# Patient Record
Sex: Female | Born: 1950 | Race: White | Hispanic: No | Marital: Married | Smoking: Former smoker
Health system: Southern US, Community
[De-identification: ages and names within clinical notes are randomized; demographics above are authoritative.]

## PROBLEM LIST (undated history)

## (undated) DIAGNOSIS — R569 Unspecified convulsions: Secondary | ICD-10-CM

## (undated) DIAGNOSIS — F419 Anxiety disorder, unspecified: Secondary | ICD-10-CM

## (undated) DIAGNOSIS — I10 Essential (primary) hypertension: Secondary | ICD-10-CM

## (undated) DIAGNOSIS — G35 Multiple sclerosis: Secondary | ICD-10-CM

## (undated) HISTORY — PX: ABDOMINAL HYSTERECTOMY: SHX81

## (undated) HISTORY — PX: EYE SURGERY: SHX253

## (undated) HISTORY — PX: KNEE SURGERY: SHX244

---

## 2014-04-17 ENCOUNTER — Encounter (HOSPITAL_COMMUNITY): Payer: Self-pay | Admitting: Emergency Medicine

## 2014-04-17 ENCOUNTER — Emergency Department (HOSPITAL_COMMUNITY)
Admission: EM | Admit: 2014-04-17 | Discharge: 2014-04-18 | Disposition: A | Discharge status: Home / Self Care | Payer: BC Managed Care – PPO | Attending: Emergency Medicine | Admitting: Emergency Medicine

## 2014-04-17 ENCOUNTER — Emergency Department (HOSPITAL_COMMUNITY): Payer: BC Managed Care – PPO

## 2014-04-17 DIAGNOSIS — R197 Diarrhea, unspecified: Secondary | ICD-10-CM | POA: Insufficient documentation

## 2014-04-17 DIAGNOSIS — G40909 Epilepsy, unspecified, not intractable, without status epilepticus: Secondary | ICD-10-CM | POA: Insufficient documentation

## 2014-04-17 DIAGNOSIS — R110 Nausea: Secondary | ICD-10-CM | POA: Insufficient documentation

## 2014-04-17 DIAGNOSIS — R569 Unspecified convulsions: Secondary | ICD-10-CM

## 2014-04-17 DIAGNOSIS — R631 Polydipsia: Secondary | ICD-10-CM | POA: Insufficient documentation

## 2014-04-17 DIAGNOSIS — R5383 Other fatigue: Secondary | ICD-10-CM

## 2014-04-17 DIAGNOSIS — G35 Multiple sclerosis: Secondary | ICD-10-CM

## 2014-04-17 DIAGNOSIS — R5381 Other malaise: Secondary | ICD-10-CM | POA: Insufficient documentation

## 2014-04-17 DIAGNOSIS — Z87891 Personal history of nicotine dependence: Secondary | ICD-10-CM | POA: Insufficient documentation

## 2014-04-17 DIAGNOSIS — Z79899 Other long term (current) drug therapy: Secondary | ICD-10-CM | POA: Insufficient documentation

## 2014-04-17 HISTORY — DX: Unspecified convulsions: R56.9

## 2014-04-17 LAB — URINALYSIS, ROUTINE W REFLEX MICROSCOPIC
BILIRUBIN URINE: NEGATIVE
GLUCOSE, UA: NEGATIVE mg/dL
Ketones, ur: NEGATIVE mg/dL
Nitrite: NEGATIVE
PH: 6 (ref 5.0–8.0)
Protein, ur: NEGATIVE mg/dL
Specific Gravity, Urine: 1.016 (ref 1.005–1.030)
Urobilinogen, UA: 0.2 mg/dL (ref 0.0–1.0)

## 2014-04-17 LAB — COMPREHENSIVE METABOLIC PANEL
ALT: 25 U/L (ref 0–35)
AST: 25 U/L (ref 0–37)
Albumin: 3.9 g/dL (ref 3.5–5.2)
Alkaline Phosphatase: 104 U/L (ref 39–117)
Anion gap: 14 (ref 5–15)
BUN: 9 mg/dL (ref 6–23)
CALCIUM: 9 mg/dL (ref 8.4–10.5)
CO2: 21 mEq/L (ref 19–32)
Chloride: 89 mEq/L — ABNORMAL LOW (ref 96–112)
Creatinine, Ser: 0.61 mg/dL (ref 0.50–1.10)
GFR calc Af Amer: >90 mL/min (ref >90)
Glucose, Bld: 130 mg/dL — ABNORMAL HIGH (ref 70–99)
Potassium: 3.8 mEq/L (ref 3.7–5.3)
SODIUM: 124 meq/L — AB (ref 137–147)
Total Bilirubin: 0.2 mg/dL — ABNORMAL LOW (ref 0.3–1.2)
Total Protein: 7.1 g/dL (ref 6.0–8.3)

## 2014-04-17 LAB — CBC WITH DIFFERENTIAL/PLATELET
Basophils Absolute: 0.1 10*3/uL (ref 0.0–0.1)
Basophils Relative: 1 % (ref 0–1)
EOS PCT: 1 % (ref 0–5)
Eosinophils Absolute: 0.1 10*3/uL (ref 0.0–0.7)
HCT: 33.7 % — ABNORMAL LOW (ref 36.0–46.0)
Hemoglobin: 11.3 g/dL — ABNORMAL LOW (ref 12.0–15.0)
LYMPHS ABS: 1.2 10*3/uL (ref 0.7–4.0)
LYMPHS PCT: 12 % (ref 12–46)
MCH: 28.5 pg (ref 26.0–34.0)
MCHC: 33.5 g/dL (ref 30.0–36.0)
MCV: 84.9 fL (ref 78.0–100.0)
Monocytes Absolute: 0.7 10*3/uL (ref 0.1–1.0)
Monocytes Relative: 7 % (ref 3–12)
Neutro Abs: 7.6 10*3/uL (ref 1.7–7.7)
Neutrophils Relative %: 79 % — ABNORMAL HIGH (ref 43–77)
Platelets: 317 10*3/uL (ref 150–400)
RBC: 3.97 MIL/uL (ref 3.87–5.11)
RDW: 14.9 % (ref 11.5–15.5)
WBC: 9.6 10*3/uL (ref 4.0–10.5)

## 2014-04-17 LAB — URINE MICROSCOPIC-ADD ON

## 2014-04-17 LAB — ETHANOL: Alcohol, Ethyl (B): <11 mg/dL (ref 0–11)

## 2014-04-17 MED ORDER — LEVETIRACETAM 500 MG PO TABS
500.0000 mg | ORAL_TABLET | Freq: Once | ORAL | Status: AC
  Administered 2014-04-17: 500 mg via ORAL
  Filled 2014-04-17 (×2): qty 1

## 2014-04-17 MED ORDER — SODIUM CHLORIDE 0.9 % IV SOLN
INTRAVENOUS | Status: DC
  Administered 2014-04-17: 19:00:00 via INTRAVENOUS

## 2014-04-17 NOTE — ED Provider Notes (Signed)
CSN: [id number]     Arrival date & time 04/17/14  1654 History   First MD Initiated Contact with Patient 04/17/14 1706     Chief Complaint  Patient presents with  . Seizures     (Consider location/radiation/quality/duration/timing/severity/associated sxs/prior Treatment) Patient is a 63 y.o. female presenting with seizures.  Seizures  Tara Wade is a 63 year old female with a past medical history of multiple sclerosis, Raynaud's, Sjogren's who was brought in to the ER by EMS after family witnessed the patient having a seizure and called 911. Patient's family who was on seen is present in her room. Family states that they witnessed patient having tonic clonic "jerking" movements for approximately 5-7 minutes. EMS reported on their arrival patient was nonverbal and by the end of their transport patient was asking repetitive questions. Patient is responding to questions appropriately during exam. Patient states she remembers vacuuming her house, then the next thing she remembers there were "a bunch of people in my house".  Patient complains of mild nausea and generalized weakness. Patient denies any recent illness other than experiencing diarrhea past 2 days. Patient denies chest pain, shortness of breath, nausea, vomiting, dizziness, blurred vision, headache, dysuria. Past Medical History  Diagnosis Date  . Seizures    Past Surgical History  Procedure Laterality Date  . Abdominal hysterectomy    . Knee surgery Left   . Eye surgery     No family history on file. History  Substance Use Topics  . Smoking status: Former Smoker -- 1.00 packs/day for 25 years    Types: Cigarettes    Quit date: 01/17/2014  . Smokeless tobacco: Never Used  . Alcohol Use: Yes     Comment: Socially    OB History   Grav Para Term Preterm Abortions TAB SAB Ect Mult Living                 Review of Systems  Constitutional: Negative for fever and chills.  Eyes: Negative for visual disturbance.  Respiratory:  Negative for shortness of breath.   Cardiovascular: Negative for chest pain and leg swelling.  Gastrointestinal: Positive for nausea and diarrhea. Negative for vomiting and abdominal pain.  Endocrine: Positive for polydipsia.  Genitourinary: Negative for dysuria.  Musculoskeletal: Negative for neck stiffness.  Skin: Negative for rash.  Neurological: Positive for seizures and weakness. Negative for dizziness, numbness and headaches.  Psychiatric/Behavioral: Negative.       Allergies  Review of patient's allergies indicates no known allergies.  Home Medications   Prior to Admission medications   Medication Sig Start Date End Date Taking? Authorizing Provider  albuterol (PROVENTIL HFA;VENTOLIN HFA) 108 (90 BASE) MCG/ACT inhaler Inhale 2 puffs into the lungs every 6 (six) hours as needed for wheezing or shortness of breath.   Yes Historical Provider, MD  ALPRAZolam Prudy Feeler) 0.5 MG tablet Take 0.5 mg by mouth 3 (three) times daily as needed for anxiety.   Yes Historical Provider, MD  amLODipine (NORVASC) 5 MG tablet Take 5 mg by mouth daily.   Yes Historical Provider, MD  donepezil (ARICEPT) 10 MG tablet Take 10 mg by mouth at bedtime.   Yes Historical Provider, MD  glatiramer (COPAXONE) 20 MG/ML SOSY injection Inject 20 mg into the skin daily.   Yes Historical Provider, MD  omeprazole (PRILOSEC) 20 MG capsule Take 20 mg by mouth daily.   Yes Historical Provider, MD  sertraline (ZOLOFT) 100 MG tablet Take 100 mg by mouth daily.   Yes Historical Provider, MD  tolterodine (  DETROL LA) 4 MG 24 hr capsule Take 4 mg by mouth daily.   Yes Historical Provider, MD  traZODone (DESYREL) 100 MG tablet Take 100 mg by mouth at bedtime.   Yes Historical Provider, MD   BP 148/96  Pulse 60  Temp(Src) 98 F (36.7 C) (Oral)  Resp 18  SpO2 100% Physical Exam  Constitutional: She is oriented to person, place, and time.  Non-toxic appearance. She does not have a sickly appearance. She does not appear ill.   Patient is a thin female sitting upright in the bed in no acute distress. Patient answered questions in full, clear sentences.  HENT:  Mouth/Throat: Oropharynx is clear and moist and mucous membranes are normal.  Eyes: EOM are normal. Pupils are equal, round, and reactive to light.  Neck: Full passive range of motion without pain.  Cardiovascular: Normal rate, regular rhythm, S1 normal, S2 normal, normal heart sounds and normal pulses.   No murmur heard. Pulses:      Radial pulses are 2+ on the right side, and 2+ on the left side.  Pulmonary/Chest: Not tachypneic. No respiratory distress.  Abdominal: Soft. Normal appearance and bowel sounds are normal. There is no tenderness.  Neurological: She is alert and oriented to person, place, and time. She has normal strength. A cranial nerve deficit is present. No sensory deficit. GCS eye subscore is 4. GCS verbal subscore is 5. GCS motor subscore is 6.  Skin: Skin is warm and dry.  Psychiatric: She has a normal mood and affect.    ED Course  Procedures (including critical care time) Labs Review Labs Reviewed  COMPREHENSIVE METABOLIC PANEL - Abnormal; Notable for the following:    Sodium 124 (*)    Chloride 89 (*)    Glucose, Bld 130 (*)    Total Bilirubin 0.2 (*)    All other components within normal limits  CBC WITH DIFFERENTIAL - Abnormal; Notable for the following:    Hemoglobin 11.3 (*)    HCT 33.7 (*)    Neutrophils Relative % 79 (*)    All other components within normal limits  URINALYSIS, ROUTINE W REFLEX MICROSCOPIC - Abnormal; Notable for the following:    Hgb urine dipstick TRACE (*)    Leukocytes, UA TRACE (*)    All other components within normal limits  ETHANOL  URINE MICROSCOPIC-ADD ON  DRUG SCREEN PANEL (SERUM)    Imaging Review Ct Head Wo Contrast  04/17/2014   CLINICAL DATA:  Seizure, altered mental status.  EXAM: CT HEAD WITHOUT CONTRAST  TECHNIQUE: Contiguous axial images were obtained from the base of the  skull through the vertex without intravenous contrast.  COMPARISON:  None.  FINDINGS: The ventricles and sulci are normal. No intraparenchymal hemorrhage, mass effect nor midline shift. No acute large vascular territory infarcts. Mild patchy supratentorial white matter hypodensities.  No abnormal extra-axial fluid collections. Basal cisterns are patent. Minimal calcific atherosclerosis of the carotid siphons.  No skull fracture. The included ocular globes and orbital contents are non-suspicious. Status post bilateral ocular lens implants. The mastoid aircells and included paranasal sinuses are well-aerated.  IMPRESSION: No acute intracranial process.  Mild white matter changes suggest chronic small vessel ischemic disease.   Electronically Signed   By: Awilda Metroourtnay  Bloomer   On: 04/17/2014 18:22     EKG Interpretation   Date/Time:  Thursday April 17 2014 18:00:27 EDT Ventricular Rate:  78 PR Interval:  168 QRS Duration: 80 QT Interval:  377 QTC Calculation: 429 R Axis:  74 Text Interpretation:  Sinus rhythm Probable left atrial enlargement  Anteroseptal infarct, age indeterminate No old tracing to compare  Confirmed by [facility]  MD, ELLIOTT [phone]) on 04/17/2014 9:36:23 PM      MDM   Final diagnoses:  None    Patient is a 63 year old female with past medical history of MS, Sjogren's, Raynaud's, and one seizure approximately 15 years ago who presented by EMS tonight after having a seizure.  Due to the fact patient was never treated for seizure medication, she only had one seizure in the past, and the length between her last seizure and tonight's, her present illness treated as a new seizure. This presentation is initially concerning for an intracranial abnormality, electrolyte abnormality, iatrogenic medical complication, or withdrawal from medication/alcohol.    1907:  Patient's CT returned and showed no acute intracranial process with mild white matter changes suggestive of chronic small vessel  ischemic disease. Patient is hyponatremic at 124. Patient was placed on 0.9% normal saline to correct sodium.  I talked with the patient at this time and she is not quite as upset. She is less confused and completely alert and oriented. Patient is on complaint to me at this time is that she wishes to go home soon. Upon interviewing patient about her medications patient stated that her PCP has recently been trying to taper her from Clonopin, and Xanax. She states that the last dose of Klonopin she had was on Saturday, 04/12/14. She states that she lives in [place], and is visiting family here in [place]. She states that since being down here, she has had increased first and has drank approximately 5-6 L of either water or iced tea every day. She states that she does not usually drink this amount of fluids back in [place].    Dr. Effie Shy discussed the case on-call neuro hospitalists who recommended starting Keppra 500 mg twice a day based on patient's history and stated her seizures were most likely attributed to her multiple sclerosis and having patient follow up with her neurologist back home in [place]. As discussed with patient's family and patient were agreeable to this plan. Patient discharged with information on seizures. Patient was encouraged to followup with her PCP and her neurologist back in [place]. Due to the fact the patient is out of town for a few more days, we encouraged her to call or return to the ED should she have any questions or concerns.  Signed,  Ladona Mow, PA-C 1:33 AM   Tara Fantasia, PA-C 04/18/14 [phone]

## 2014-04-17 NOTE — ED Notes (Signed)
Pt walking to the bathroom without any issues

## 2014-04-17 NOTE — ED Notes (Addendum)
Per EMS: The patient called her friend from upstairs. The friend states that when he came downstairs he found the patient shaking with muscles tightened, which lasted 5-7 minutes. EMS states that upon arrival the patient was non-verbal, and now repetitively asks "what happened to me?" Patient states that she has had a seizure in the past, however doesn't take any medication for seizures. EMS reports no trauma, no incontinence, no tongue laceration.

## 2014-04-17 NOTE — Discharge Instructions (Signed)
Seizure, Adult °A seizure is abnormal electrical activity in the brain. Seizures usually last from 30 seconds to 2 minutes. There are various types of seizures. °Before a seizure, you may have a warning sensation (aura) that a seizure is about to occur. An aura may include the following symptoms:  °· Fear or anxiety. °· Nausea. °· Feeling like the room is spinning (vertigo). °· Vision changes, such as seeing flashing lights or spots. °Common symptoms during a seizure include: °· A change in attention or behavior (altered mental status). °· Convulsions with rhythmic jerking movements. °· Drooling. °· Rapid eye movements. °· Grunting. °· Loss of bladder and bowel control. °· Bitter taste in the mouth. °· Tongue biting. °After a seizure, you may feel confused and sleepy. You may also have an injury resulting from convulsions during the seizure. °HOME CARE INSTRUCTIONS  °· If you are given medicines, take them exactly as prescribed by your health care provider. °· Keep all follow-up appointments as directed by your health care provider. °· Do not swim or drive or engage in risky activity during which a seizure could cause further injury to you or others until your health care provider says it is OK. °· Get adequate rest. °· Teach friends and family what to do if you have a seizure. They should: °· Lay you on the ground to prevent a fall. °· Put a cushion under your head. °· Loosen any tight clothing around your neck. °· Turn you on your side. If vomiting occurs, this helps keep your airway clear. °· Stay with you until you recover. °· Know whether or not you need emergency care. °SEEK IMMEDIATE MEDICAL CARE IF: °· The seizure lasts longer than 5 minutes. °· The seizure is severe or you do not wake up immediately after the seizure. °· You have an altered mental status after the seizure. °· You are having more frequent or worsening seizures. °Someone should drive you to the emergency department or call local emergency  services (911 in [place] °MAKE SURE YOU: °· Understand these instructions. °· Will watch your condition. °· Will get help right away if you are not doing well or get worse. °Document Released: 09/02/2000 Document Revised: 06/26/2013 Document Reviewed: 04/17/2013 °[employer]® Patient Information ©2015 [employer]. This information is not intended to replace advice given to you by your health care provider. Make sure you discuss any questions you have with your health care provider. ° °Driving and Equipment Restrictions °Some medical problems make it dangerous to drive, ride a bike, or use machines. Some of these problems are: °· A hard blow to the head (concussion). °· Passing out (fainting). °· Twitching and shaking (seizures). °· Low blood sugar. °· Taking medicine to help you relax (sedatives). °· Taking pain medicines. °· Wearing an eye patch. °· Wearing splints. This can make it hard to use parts of your body that you need to drive safely. °HOME CARE  °· Do not drive until your doctor says it is okay. °· Do not use machines until your doctor says it is okay. °You may need a form signed by your doctor (medical release) before you can drive again. You may also need this form before you do other tasks where you need to be fully alert. °MAKE SURE YOU: °· Understand these instructions. °· Will watch your condition. °· Will get help right away if you are not doing well or get worse. °Document Released: 10/13/2004 Document Revised: 11/28/2011 Document Reviewed: 01/13/2010 °[employer]® Patient Information ©2015 [employer].   This information is not intended to replace advice given to you by your health care provider. Make sure you discuss any questions you have with your health care provider. ° °

## 2014-04-17 NOTE — ED Provider Notes (Signed)
  Face-to-face evaluation   History: She had a tonic-clonic seizure tonight. Witnessed by family. Postictal state 15-20 minutes. No trauma. 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 No change in Xanax dose for 3 weeks. No new medications.  Physical exam: Alert female. Nonfocal neurologic exam. No dysarthria, dysphasia, or nystagmus.  I discussed the case with on-call neuro hospitalist- he recommends starting Keppra 500 mg twice a day  I discussed these findings with family members in the room, and another one, and ER doctor, on the phone.   Medical screening examination/treatment/procedure(s) were conducted as a shared visit with non-physician practitioner(s) and myself.  I personally evaluated the patient during the encounter  Flint Melter, MD 04/18/14 1205

## 2014-04-18 MED ORDER — LEVETIRACETAM 500 MG PO TABS: 500.0000 mg | ORAL_TABLET | Freq: Two times a day (BID) | ORAL | Status: DC

## 2014-04-20 LAB — DRUG SCREEN PANEL (SERUM)

## 2020-01-28 ENCOUNTER — Inpatient Hospital Stay (HOSPITAL_COMMUNITY)
Admission: EM | Admit: 2020-01-28 | Discharge: 2020-01-30 | DRG: 101 | Disposition: A | Discharge status: Home / Self Care | Payer: Medicare (Managed Care) | Attending: Internal Medicine | Admitting: Internal Medicine

## 2020-01-28 ENCOUNTER — Emergency Department (HOSPITAL_COMMUNITY): Payer: Medicare (Managed Care)

## 2020-01-28 ENCOUNTER — Encounter (HOSPITAL_COMMUNITY): Payer: Self-pay | Admitting: Emergency Medicine

## 2020-01-28 ENCOUNTER — Other Ambulatory Visit: Payer: Self-pay

## 2020-01-28 DIAGNOSIS — G40909 Epilepsy, unspecified, not intractable, without status epilepticus: Principal | ICD-10-CM | POA: Diagnosis present

## 2020-01-28 DIAGNOSIS — Z87891 Personal history of nicotine dependence: Secondary | ICD-10-CM

## 2020-01-28 DIAGNOSIS — E878 Other disorders of electrolyte and fluid balance, not elsewhere classified: Secondary | ICD-10-CM | POA: Diagnosis not present

## 2020-01-28 DIAGNOSIS — D649 Anemia, unspecified: Secondary | ICD-10-CM | POA: Diagnosis present

## 2020-01-28 DIAGNOSIS — Z79899 Other long term (current) drug therapy: Secondary | ICD-10-CM

## 2020-01-28 DIAGNOSIS — Z20822 Contact with and (suspected) exposure to covid-19: Secondary | ICD-10-CM | POA: Diagnosis present

## 2020-01-28 DIAGNOSIS — E876 Hypokalemia: Secondary | ICD-10-CM | POA: Diagnosis present

## 2020-01-28 DIAGNOSIS — I10 Essential (primary) hypertension: Secondary | ICD-10-CM | POA: Diagnosis present

## 2020-01-28 DIAGNOSIS — S0003XA Contusion of scalp, initial encounter: Secondary | ICD-10-CM | POA: Insufficient documentation

## 2020-01-28 DIAGNOSIS — G35 Multiple sclerosis: Secondary | ICD-10-CM | POA: Diagnosis present

## 2020-01-28 DIAGNOSIS — F419 Anxiety disorder, unspecified: Secondary | ICD-10-CM | POA: Diagnosis present

## 2020-01-28 DIAGNOSIS — H8112 Benign paroxysmal vertigo, left ear: Secondary | ICD-10-CM | POA: Diagnosis present

## 2020-01-28 DIAGNOSIS — H8110 Benign paroxysmal vertigo, unspecified ear: Secondary | ICD-10-CM | POA: Diagnosis present

## 2020-01-28 DIAGNOSIS — S0990XA Unspecified injury of head, initial encounter: Secondary | ICD-10-CM

## 2020-01-28 DIAGNOSIS — E871 Hypo-osmolality and hyponatremia: Secondary | ICD-10-CM | POA: Diagnosis present

## 2020-01-28 DIAGNOSIS — R519 Headache, unspecified: Secondary | ICD-10-CM | POA: Diagnosis present

## 2020-01-28 DIAGNOSIS — R42 Dizziness and giddiness: Secondary | ICD-10-CM

## 2020-01-28 DIAGNOSIS — Z820 Family history of epilepsy and other diseases of the nervous system: Secondary | ICD-10-CM

## 2020-01-28 DIAGNOSIS — R569 Unspecified convulsions: Secondary | ICD-10-CM

## 2020-01-28 DIAGNOSIS — Z90710 Acquired absence of both cervix and uterus: Secondary | ICD-10-CM

## 2020-01-28 HISTORY — DX: Anxiety disorder, unspecified: F41.9

## 2020-01-28 HISTORY — DX: Essential (primary) hypertension: I10

## 2020-01-28 HISTORY — DX: Multiple sclerosis: G35

## 2020-01-28 LAB — URINALYSIS, ROUTINE W REFLEX MICROSCOPIC
Bacteria, UA: NONE SEEN
Bilirubin Urine: NEGATIVE
Glucose, UA: NEGATIVE mg/dL
Ketones, ur: 5 mg/dL — AB
Leukocytes,Ua: NEGATIVE
Nitrite: NEGATIVE
Protein, ur: NEGATIVE mg/dL
Specific Gravity, Urine: 1.009 (ref 1.005–1.030)
pH: 6 (ref 5.0–8.0)

## 2020-01-28 LAB — CBC
HCT: 35 % — ABNORMAL LOW (ref 36.0–46.0)
Hemoglobin: 11.5 g/dL — ABNORMAL LOW (ref 12.0–15.0)
MCH: 30.7 pg (ref 26.0–34.0)
MCHC: 32.9 g/dL (ref 30.0–36.0)
MCV: 93.6 fL (ref 80.0–100.0)
Platelets: 359 10*3/uL (ref 150–400)
RBC: 3.74 MIL/uL — ABNORMAL LOW (ref 3.87–5.11)
RDW: 12.6 % (ref 11.5–15.5)
WBC: 8.2 10*3/uL (ref 4.0–10.5)
nRBC: 0 % (ref 0.0–0.2)

## 2020-01-28 LAB — COMPREHENSIVE METABOLIC PANEL
ALT: 18 U/L (ref 0–44)
AST: 27 U/L (ref 15–41)
Albumin: 4 g/dL (ref 3.5–5.0)
Alkaline Phosphatase: 85 U/L (ref 38–126)
Anion gap: 14 (ref 5–15)
BUN: 11 mg/dL (ref 8–23)
CO2: 17 mmol/L — ABNORMAL LOW (ref 22–32)
Calcium: 7.8 mg/dL — ABNORMAL LOW (ref 8.9–10.3)
Chloride: 100 mmol/L (ref 98–111)
Creatinine, Ser: 0.94 mg/dL (ref 0.44–1.00)
GFR calc Af Amer: >60 mL/min (ref >60)
GFR calc non Af Amer: >60 mL/min (ref >60)
Glucose, Bld: 115 mg/dL — ABNORMAL HIGH (ref 70–99)
Potassium: 2.7 mmol/L — CL (ref 3.5–5.1)
Sodium: 131 mmol/L — ABNORMAL LOW (ref 135–145)
Total Bilirubin: 0.4 mg/dL (ref 0.3–1.2)
Total Protein: 6.3 g/dL — ABNORMAL LOW (ref 6.5–8.1)

## 2020-01-28 LAB — RAPID URINE DRUG SCREEN, HOSP PERFORMED
Amphetamines: NOT DETECTED
Barbiturates: NOT DETECTED
Benzodiazepines: NOT DETECTED
Cocaine: NOT DETECTED
Opiates: NOT DETECTED
Tetrahydrocannabinol: NOT DETECTED

## 2020-01-28 LAB — ETHANOL: Alcohol, Ethyl (B): <10 mg/dL (ref <10)

## 2020-01-28 LAB — MAGNESIUM: Magnesium: 0.7 mg/dL — CL (ref 1.7–2.4)

## 2020-01-28 MED ORDER — POTASSIUM CHLORIDE 10 MEQ/100ML IV SOLN
10.0000 meq | INTRAVENOUS | Status: AC
  Administered 2020-01-28 (×2): 10 meq via INTRAVENOUS
  Filled 2020-01-28 (×2): qty 100

## 2020-01-28 MED ORDER — SODIUM CHLORIDE 1 G PO TABS
1.0000 g | ORAL_TABLET | Freq: Two times a day (BID) | ORAL | Status: DC
  Administered 2020-01-29 – 2020-01-30 (×4): 1 g via ORAL
  Filled 2020-01-28 (×5): qty 1

## 2020-01-28 MED ORDER — POTASSIUM CHLORIDE CRYS ER 20 MEQ PO TBCR
60.0000 meq | EXTENDED_RELEASE_TABLET | Freq: Once | ORAL | Status: AC
  Administered 2020-01-28: 60 meq via ORAL
  Filled 2020-01-28: qty 3

## 2020-01-28 MED ORDER — HYDRALAZINE HCL 20 MG/ML IJ SOLN
10.0000 mg | INTRAMUSCULAR | Status: DC | PRN
  Administered 2020-01-29: 10 mg via INTRAVENOUS
  Filled 2020-01-28: qty 1

## 2020-01-28 MED ORDER — SODIUM CHLORIDE 0.9 % IV BOLUS
1000.0000 mL | Freq: Once | INTRAVENOUS | Status: AC
  Administered 2020-01-28: 1000 mL via INTRAVENOUS

## 2020-01-28 MED ORDER — CALCIUM CARBONATE ANTACID 500 MG PO CHEW
400.0000 mg | CHEWABLE_TABLET | Freq: Once | ORAL | Status: AC
  Administered 2020-01-28: 400 mg via ORAL
  Filled 2020-01-28: qty 2

## 2020-01-28 MED ORDER — LORAZEPAM 2 MG/ML IJ SOLN: 1.0000 mg | INTRAMUSCULAR | Status: DC | PRN

## 2020-01-28 MED ORDER — OXCARBAZEPINE 300 MG PO TABS
300.0000 mg | ORAL_TABLET | Freq: Every day | ORAL | Status: DC
  Administered 2020-01-28 – 2020-01-29 (×2): 300 mg via ORAL
  Filled 2020-01-28 (×2): qty 1

## 2020-01-28 MED ORDER — LEVETIRACETAM IN NACL 1500 MG/100ML IV SOLN
1500.0000 mg | Freq: Once | INTRAVENOUS | Status: DC
  Filled 2020-01-28: qty 100

## 2020-01-28 MED ORDER — KETOROLAC TROMETHAMINE 15 MG/ML IJ SOLN
15.0000 mg | Freq: Once | INTRAMUSCULAR | Status: AC
  Administered 2020-01-28: 15 mg via INTRAVENOUS
  Filled 2020-01-28: qty 1

## 2020-01-28 MED ORDER — SODIUM CHLORIDE 0.9 % IV BOLUS
500.0000 mL | Freq: Once | INTRAVENOUS | Status: AC
  Administered 2020-01-28: 500 mL via INTRAVENOUS

## 2020-01-28 MED ORDER — SODIUM CHLORIDE 0.9 % IV SOLN
1.0000 g | Freq: Once | INTRAVENOUS | Status: DC
  Filled 2020-01-28: qty 10

## 2020-01-28 MED ORDER — BUSPIRONE HCL 15 MG PO TABS
7.5000 mg | ORAL_TABLET | Freq: Two times a day (BID) | ORAL | Status: DC
  Administered 2020-01-29 – 2020-01-30 (×4): 7.5 mg via ORAL
  Filled 2020-01-28 (×5): qty 1

## 2020-01-28 MED ORDER — QUETIAPINE FUMARATE ER 400 MG PO TB24
400.0000 mg | ORAL_TABLET | Freq: Every day | ORAL | Status: DC
  Administered 2020-01-29 – 2020-01-30 (×2): 400 mg via ORAL
  Filled 2020-01-28 (×2): qty 1

## 2020-01-28 MED ORDER — METOCLOPRAMIDE HCL 5 MG/ML IJ SOLN
10.0000 mg | Freq: Once | INTRAMUSCULAR | Status: AC
  Administered 2020-01-28: 10 mg via INTRAVENOUS
  Filled 2020-01-28: qty 2

## 2020-01-28 MED ORDER — SERTRALINE HCL 100 MG PO TABS
100.0000 mg | ORAL_TABLET | Freq: Every day | ORAL | Status: DC
  Administered 2020-01-29 – 2020-01-30 (×2): 100 mg via ORAL
  Filled 2020-01-28 (×2): qty 1

## 2020-01-28 MED ORDER — VENLAFAXINE HCL ER 75 MG PO CP24
75.0000 mg | ORAL_CAPSULE | Freq: Every day | ORAL | Status: DC
  Administered 2020-01-29 – 2020-01-30 (×2): 75 mg via ORAL
  Filled 2020-01-28 (×3): qty 1

## 2020-01-28 MED ORDER — MAGNESIUM SULFATE 2 GM/50ML IV SOLN
2.0000 g | Freq: Once | INTRAVENOUS | Status: AC
  Administered 2020-01-28: 2 g via INTRAVENOUS
  Filled 2020-01-28: qty 50

## 2020-01-28 NOTE — ED Provider Notes (Signed)
[facility] [place] EMERGENCY DEPARTMENT Provider Note   CSN: [id number] Arrival date & time: 01/28/20  1731     History Chief Complaint  Patient presents with  . Seizures    Tara Wade is a 69 y.o. female with a history of multiple sclerosis, seizures, presented emergency department with concern for seizure.  Patient reports that she went to get her haircut and went to [employer] today and then does not remember without afterwards.  EMS reported that the patient exited the store and was found on the ground, initially concern for postictal.  The patient's daughter-in-law arrived on scene and said the patient initially was extremely confused.  She did seem to wake up more as EMS arrived.  Patient reports hitting the back of her head on the ground.  She has a minor headache.  She denies myalgias anywhere else.  She denies neck pain.  She is not on blood thinners.  She was placed in a C-spine collar by EMS.  Here she feels lightheaded with sitting upright.  She does report a distant history of seizures but does not think she is had a seizure in approximately 4 years.  Per her medical list provided by her daughter, she is on oxcarbamazapine 300 mg x 1 daily.  She is from [place], and has neurologist in [place] for her multiple sclerosis.  She was planning to return to [place] tomorrow with her daughter in law driving.  Per our records she was last treated for seizure in 2015 and prescribed Keppra 500 mg BID at that time.  She was noted to have some hyponatremia (Na 124) at that time felt to be iatrogenic and due to her drinking 5-6L of water every day.    HPI     Past Medical History:  Diagnosis Date  . Anxiety   . Hypertension   . Multiple sclerosis ([place])   . Seizures [facility])     Patient Active Problem List   Diagnosis Date Noted  . Seizure ([place]) 01/28/2020  . Electrolyte abnormality 01/28/2020  . Multiple sclerosis ([place]) 01/28/2020  . Essential hypertension  01/28/2020  . Hematoma of scalp     Past Surgical History:  Procedure Laterality Date  . ABDOMINAL HYSTERECTOMY    . EYE SURGERY    . KNEE SURGERY Left      OB History   No obstetric history on file.     Family History  Problem Relation Age of Onset  . Multiple sclerosis Brother   . Seizures Nephew     Social History   Tobacco Use  . Smoking status: Former Smoker    Packs/day: 1.00    Years: 25.00    Pack years: 25.00    Types: Cigarettes    Quit date: 01/17/2014    Years since quitting: 6.0  . Smokeless tobacco: Never Used  Substance Use Topics  . Alcohol use: Yes    Comment: Socially   . Drug use: No    Home Medications Prior to Admission medications   Medication Sig Start Date End Date Taking? Authorizing Provider  albuterol (PROVENTIL HFA;VENTOLIN HFA) 108 (90 BASE) MCG/ACT inhaler Inhale 2 puffs into the lungs every 6 (six) hours as needed for wheezing or shortness of breath.   Yes [provider]  amLODipine (NORVASC) 10 MG tablet Take 10 mg by mouth daily. 01/23/20  Yes [provider]  Biotin 1000 MCG tablet Take 1,000 mcg by mouth in the morning and at bedtime.   Yes  [provider]  busPIRone (BUSPAR) 7.5 MG tablet Take 7.5 mg by mouth 2 (two) times daily. 01/24/20  Yes [provider]  levETIRAcetam (KEPPRA) 500 MG tablet Take 1 tablet (500 mg total) by mouth 2 (two) times daily. 04/18/14  Yes Mancel Bale, MD  modafinil (PROVIGIL) 200 MG tablet Take 200 mg by mouth every morning. 12/26/19  Yes [provider]  omeprazole (PRILOSEC) 20 MG capsule Take 20 mg by mouth in the morning and at bedtime.    Yes [provider]  QUEtiapine (SEROQUEL XR) 400 MG 24 hr tablet Take 400 mg by mouth daily. 01/23/20  Yes [provider]  RESTASIS 0.05 % ophthalmic emulsion Place 1 drop into both eyes 2 (two) times daily. 11/11/19  Yes [provider]  sertraline (ZOLOFT) 100 MG tablet Take 100 mg by mouth  daily.   Yes [provider]  sodium chloride 1 g tablet Take 1 g by mouth 2 (two) times daily. 01/23/20  Yes [provider]  tolterodine (DETROL LA) 4 MG 24 hr capsule Take 4 mg by mouth daily.   Yes [provider]  venlafaxine XR (EFFEXOR-XR) 75 MG 24 hr capsule Take 75 mg by mouth daily. 01/23/20  Yes [provider]  ALPRAZolam Prudy Feeler) 0.5 MG tablet Take 0.5 mg by mouth 3 (three) times daily as needed for anxiety.    [provider]  donepezil (ARICEPT) 10 MG tablet Take 10 mg by mouth at bedtime.    [provider]  gabapentin (NEURONTIN) 600 MG tablet Take 600 mg by mouth in the morning, at noon, and at bedtime. 05/02/06   [provider]  traZODone (DESYREL) 100 MG tablet Take 100 mg by mouth at bedtime.    [provider]    Allergies    Patient has no known allergies.  Review of Systems   Review of Systems  Constitutional: Negative for chills and fever.  Eyes: Negative for photophobia and visual disturbance.  Respiratory: Negative for cough and shortness of breath.   Cardiovascular: Negative for chest pain and palpitations.  Gastrointestinal: Negative for abdominal pain and vomiting.  Genitourinary: Negative for dysuria and hematuria.  Musculoskeletal: Negative for arthralgias, back pain, neck pain and neck stiffness.  Skin: Negative for color change and rash.  Neurological: Positive for seizures, light-headedness and headaches. Negative for syncope.  Psychiatric/Behavioral: Negative for agitation and confusion.  All other systems reviewed and are negative.   Physical Exam Updated Vital Signs BP (!) 148/79   Pulse 64   Temp 97.8 F (36.6 C) (Oral)   Resp 13   Ht 5\' 3"  (1.6 m)   Wt 47.6 kg   SpO2 99%   BMI 18.60 kg/m   Physical Exam Vitals and nursing note reviewed.  Constitutional:      General: She is not in acute distress.    Appearance: She is well-developed.  HENT:     Head: Normocephalic.      Comments: Hematoma to back of scalp (occiput), boggy, with macerated overlying skin but no open laceration, mild oozing Eyes:     Conjunctiva/sclera: Conjunctivae normal.  Neck:     Comments: C spine collar cleared, no spinal tenderness Cardiovascular:     Rate and Rhythm: Normal rate and regular rhythm.     Pulses: Normal pulses.  Pulmonary:     Effort: Pulmonary effort is normal. No respiratory distress.     Breath sounds: Normal breath sounds.  Abdominal:     Palpations: Abdomen  is soft.     Tenderness: There is no abdominal tenderness.  Musculoskeletal:     Cervical back: Normal range of motion and neck supple. No tenderness.  Skin:    General: Skin is warm and dry.  Neurological:     General: No focal deficit present.     Mental Status: She is alert and oriented to person, place, and time.     Motor: No weakness.  Psychiatric:        Mood and Affect: Mood normal.        Behavior: Behavior normal.     ED Results / Procedures / Treatments   Labs (all labs ordered are listed, but only abnormal results are displayed) Labs Reviewed  COMPREHENSIVE METABOLIC PANEL - Abnormal; Notable for the following components:      Result Value   Sodium 131 (*)    Potassium 2.7 (*)    CO2 17 (*)    Glucose, Bld 115 (*)    Calcium 7.8 (*)    Total Protein 6.3 (*)    All other components within normal limits  CBC - Abnormal; Notable for the following components:   RBC 3.74 (*)    Hemoglobin 11.5 (*)    HCT 35.0 (*)    All other components within normal limits  URINALYSIS, ROUTINE W REFLEX MICROSCOPIC - Abnormal; Notable for the following components:   Color, Urine STRAW (*)    Hgb urine dipstick SMALL (*)    Ketones, ur 5 (*)    All other components within normal limits  MAGNESIUM - Abnormal; Notable for the following components:   Magnesium 0.7 (*)    All other components within normal limits  SARS CORONAVIRUS 2 (TAT 6-24 HRS)  ETHANOL  RAPID URINE DRUG SCREEN, HOSP  PERFORMED  HIV ANTIBODY (ROUTINE TESTING W REFLEX)  10-HYDROXYCARBAZEPINE    EKG None  Radiology CT Head Wo Contrast  Result Date: 01/28/2020 CLINICAL DATA:  Status post trauma. EXAM: CT HEAD WITHOUT CONTRAST TECHNIQUE: Contiguous axial images were obtained from the base of the skull through the vertex without intravenous contrast. COMPARISON:  April 17, 2014 FINDINGS: Brain: There is mild cerebral atrophy with widening of the extra-axial spaces and ventricular dilatation. There are areas of decreased attenuation within the white matter tracts of the supratentorial brain, consistent with microvascular disease changes. Vascular: No hyperdense vessel or unexpected calcification. Skull: Normal. Negative for fracture or focal lesion. Sinuses/Orbits: No acute finding. Other: There is moderate to marked severity right occipital scalp soft tissue swelling with associated scalp hematoma. IMPRESSION: 1. Moderate to marked severity right occipital scalp soft tissue swelling with associated scalp hematoma. 2. No acute intracranial abnormality. Electronically Signed   By: Aram Candela M.D.   On: 01/28/2020 19:13    Procedures .Critical Care Performed by: Terald Sleeper, MD Authorized by: Terald Sleeper, MD   Critical care provider statement:    Critical care time (minutes):  45   Critical care was necessary to treat or prevent imminent or life-threatening deterioration of the following conditions:  Metabolic crisis   Critical care was time spent personally by me on the following activities:  Discussions with consultants, evaluation of patient's response to treatment, examination of patient, ordering and performing treatments and interventions, ordering and review of laboratory studies, ordering and review of radiographic studies, pulse oximetry, re-evaluation of patient's condition, obtaining history from patient or surrogate and review of old charts Comments:     Repletion of magnesium and  potassium IV, concern  for seizures in setting of low magnesium   (including critical care time)  Medications Ordered in ED Medications  Oxcarbazepine (TRILEPTAL) tablet 300 mg (300 mg Oral Given 01/28/20 2042)  sodium chloride tablet 1 g (has no administration in time range)  venlafaxine XR (EFFEXOR-XR) 24 hr capsule 75 mg (has no administration in time range)  sertraline (ZOLOFT) tablet 100 mg (has no administration in time range)  QUEtiapine (SEROQUEL XR) 24 hr tablet 400 mg (has no administration in time range)  busPIRone (BUSPAR) tablet 7.5 mg (has no administration in time range)  LORazepam (ATIVAN) injection 1 mg (has no administration in time range)  hydrALAZINE (APRESOLINE) injection 10 mg (has no administration in time range)  sodium chloride 0.9 % bolus 1,000 mL (0 mLs Intravenous Stopped 01/28/20 1950)  potassium chloride 10 mEq in 100 mL IVPB (0 mEq Intravenous Stopped 01/28/20 2251)  ketorolac (TORADOL) 15 MG/ML injection 15 mg (15 mg Intravenous Given 01/28/20 2028)  metoCLOPramide (REGLAN) injection 10 mg (10 mg Intravenous Given 01/28/20 2028)  sodium chloride 0.9 % bolus 500 mL (0 mLs Intravenous Stopped 01/28/20 2123)  calcium carbonate (TUMS - dosed in mg elemental calcium) chewable tablet 400 mg of elemental calcium (400 mg of elemental calcium Oral Given 01/28/20 2028)  magnesium sulfate IVPB 2 g 50 mL (0 g Intravenous Stopped 01/28/20 2123)  potassium chloride SA (KLOR-CON) CR tablet 60 mEq (60 mEq Oral Given 01/28/20 2257)    ED Course  I have reviewed the triage vital signs and the nursing notes.  Pertinent labs & imaging results that were available during my care of the patient were reviewed by me and considered in my medical decision making (see chart for details).  69 yo female here with suspected seizure.  Hx of MS and seizures, on oxcarbamazpine 300 mg per her written home med list.  Her neurologist is in [place], where she was planning to return tomorrow with her  daughter in law driving her.  She has a hematoma to the posterior occiput with is moderate sized with bogginess, macerated on the surface with small amount of oozing.  I do not think this is amenable to staples or sutures.  We'll try a pressure bandage with head wrap and have her maintain pressure by lying back on the bed.  Labs ordered and personally reviewed, notable for hypokalemia, hypocalcemia, and hypomagnesemia.  She is on NaCl tablets for persistent hyponatremia issues, and her Na is only 131 here - doubtful this would be a cause of seizure.  The low Mg is a likely trigger for seizures.  She has been compliant with her meds otherwise, and does not drink etoh, or have signs or symptoms of infections.  I think this can be medically managed by repleting her Mg and other electrolytes, hopefully she would be stable for discharge tomorrow, but an overnight observation and lab recheck in the morning would certainly be reasonable in my opinion.  I do not think there is an indication for emergent neurology consultation at the moment.  No evidence of status epilepticus, and there is a medical explanation for her episode today.  CTH negative for acute bleed No other evidence of acute injury on my exam including C spine or spinal injury, or extremity fracture EKG per my interpretation shows NSR with no prolonged QTc   Clinical Course as of Jan 28 26  Tue Jan 28, 2020  1919 IMPRESSION: 1. Moderate to marked severity right occipital scalp soft tissue swelling with associated scalp hematoma.  2. No acute intracranial abnormality.   [MT]  2035 Magnesium(!!): 0.7 [MT]  2035 Potassium(!!): 2.7 [MT]  2035 Low mag may be the trigger for her seizures, and also the reason she has not been able to replete her potassium at home with her diet.   Unclear why mag is low.  She reports she is not a daily or heavy drinker.  Perhaps a medication side effect?   [MT]  2036 With all these electrolyte derangements we  will admit to the hospital for repletion, monitoring overnight.   [MT]  2114 Signed out to hosptialist   [MT]    Clinical Course User Index [MT] Ervin Rothbauer, Kermit Balo, MD    Final Clinical Impression(s) / ED Diagnoses Final diagnoses:  Seizure ([facility])  Hypomagnesemia  Hypokalemia  Hypocalcemia  Hematoma of scalp, initial encounter  Injury of head, initial encounter    Rx / DC Orders ED Discharge Orders    None       Kacie Huxtable, Kermit Balo, MD 01/29/20 0028

## 2020-01-28 NOTE — ED Notes (Signed)
Low pottasium result reported to EDP.

## 2020-01-28 NOTE — ED Triage Notes (Addendum)
Pt presents from outside a store where family exited and found pt on ground not speaking, transported by [facility]. Pt has a h/o seizures, MS, takes medications for seizures but unable to recall. Pt is visiting from out of town and family unsure of medical hx or meds. Pt denies blood thinners.  EMS exam: placed in c-collar, denying pain. Post-ictal upon arrival, alert and oriented to name only. Hematoma to posterior scalp, bleeding controlled, clots noted.  CBG85, 170/96,97%RA, 100bpm   Denies alcohol use

## 2020-01-28 NOTE — H&P (Signed)
History and Physical    Tara Wade:[id number] DOB: [birth year] DOA: 01/28/2020  PCP: Patient, No Pcp Per  Patient coming from: Home.  Chief Complaint: Seizures.  HPI: Tara Wade is a 69 y.o. female with known history of seizure has not had any seizures for last 4 years on Trileptal with history of multiple sclerosis depression anxiety and hypertension who is visiting [place] from [place] has been experiencing diarrhea multiple episodes watery for the last 2 days.  Had gone to the mall today for shopping when patient experienced tonic-clonic seizure hold onto the floor hit her head and had a scalp hematoma.  Patient states since this morning patient has been feeling dizzy on standing.  Denies any fever chills nausea vomiting abdominal pain chest pain or shortness of breath.  Has been compliant with her medications.  ED Course: In the ER CT head did not show any acute except for the scalp hematoma.  EKG shows normal sinus rhythm nonspecific changes.  Patient appears nonfocal in the ER.  Appears mildly postictal.  Labs are remarkable for potassium of 2.7 sodium 131 calcium 7.8 magnesium 1.7 hemoglobin 9.5 Covid test was negative.  Patient was started on her regular dose of Trileptal and patient had potassium magnesium and calcium replacement done.  Patient admitted for further observation.  Review of Systems: As per HPI, rest all negative.   Past Medical History:  Diagnosis Date  . Anxiety   . Hypertension   . Multiple sclerosis ([place])   . Seizures ([place])     Past Surgical History:  Procedure Laterality Date  . ABDOMINAL HYSTERECTOMY    . EYE SURGERY    . KNEE SURGERY Left      reports that she quit smoking about 6 years ago. Her smoking use included cigarettes. She has a 25.00 pack-year smoking history. She has never used smokeless tobacco. She reports current alcohol use. She reports that she does not use drugs.  No Known Allergies  Family History  Problem Relation  Age of Onset  . Multiple sclerosis Brother   . Seizures Nephew     Prior to Admission medications   Medication Sig Start Date End Date Taking? Authorizing Provider  albuterol (PROVENTIL HFA;VENTOLIN HFA) 108 (90 BASE) MCG/ACT inhaler Inhale 2 puffs into the lungs every 6 (six) hours as needed for wheezing or shortness of breath.   Yes [provider]  amLODipine (NORVASC) 10 MG tablet Take 10 mg by mouth daily. 01/23/20  Yes [provider]  Biotin 1000 MCG tablet Take 1,000 mcg by mouth in the morning and at bedtime.   Yes [provider]  busPIRone (BUSPAR) 7.5 MG tablet Take 7.5 mg by mouth 2 (two) times daily. 01/24/20  Yes [provider]  levETIRAcetam (KEPPRA) 500 MG tablet Take 1 tablet (500 mg total) by mouth 2 (two) times daily. 04/18/14  Yes Daleen Bo, MD  modafinil (PROVIGIL) 200 MG tablet Take 200 mg by mouth every morning. 12/26/19  Yes [provider]  omeprazole (PRILOSEC) 20 MG capsule Take 20 mg by mouth in the morning and at bedtime.    Yes [provider]  QUEtiapine (SEROQUEL XR) 400 MG 24 hr tablet Take 400 mg by mouth daily. 01/23/20  Yes [provider]  RESTASIS 0.05 % ophthalmic emulsion Place 1 drop into both eyes 2 (two) times daily. 11/11/19  Yes [provider]  sertraline (ZOLOFT) 100 MG tablet Take 100 mg by mouth daily.   Yes [provider]  sodium chloride 1 g tablet Take 1 g by mouth 2 (two) times daily. 01/23/20  Yes [provider]  tolterodine (DETROL LA) 4 MG 24 hr capsule Take 4 mg by mouth daily.   Yes [provider]  venlafaxine XR (EFFEXOR-XR) 75 MG 24 hr capsule Take 75 mg by mouth daily. 01/23/20  Yes [provider]  ALPRAZolam Prudy Feeler) 0.5 MG tablet Take 0.5 mg by mouth 3 (three) times daily as needed for anxiety.    [provider]  donepezil (ARICEPT) 10 MG tablet Take 10 mg by mouth at bedtime.    [provider]  gabapentin  (NEURONTIN) 600 MG tablet Take 600 mg by mouth in the morning, at noon, and at bedtime. 05/02/06   [provider]  traZODone (DESYREL) 100 MG tablet Take 100 mg by mouth at bedtime.    [provider]    Physical Exam: Constitutional: Moderately built and nourished. Vitals:   01/28/20 2030 01/28/20 2100 01/28/20 2230 01/28/20 2245  BP: (!) 146/82 139/78 (!) 150/88 (!) 148/76  Pulse: 86 74 64 66  Resp: 16 20 11 10   Temp:      TempSrc:      SpO2: 99% 97% 97% 97%  Weight:      Height:       Eyes: Anicteric no pallor. ENMT: Scalp hematoma. Neck: No neck rigidity. Respiratory: No rhonchi or crepitations. Cardiovascular: S1-S2 heard. Abdomen: Soft nontender bowel sounds present. Musculoskeletal: No edema. Skin: No rash. Neurologic: Alert awake oriented time place and person.  Moves all extremities. Psychiatric: Appears normal.   Labs on Admission: I have personally reviewed following labs and imaging studies  CBC: Recent Labs  Lab 01/28/20 1813  WBC 8.2  HGB 11.5*  HCT 35.0*  MCV 93.6  PLT 359   Basic Metabolic Panel: Recent Labs  Lab 01/28/20 1813 01/28/20 2000  NA 131*  --   K 2.7*  --   CL 100  --   CO2 17*  --   GLUCOSE 115*  --   BUN 11  --   CREATININE 0.94  --   CALCIUM 7.8*  --   MG  --  0.7*   GFR: Estimated Creatinine Clearance: 43.6 mL/min (by C-G formula based on SCr of 0.94 mg/dL). Liver Function Tests: Recent Labs  Lab 01/28/20 1813  AST 27  ALT 18  ALKPHOS 85  BILITOT 0.4  PROT 6.3*  ALBUMIN 4.0   No results for input(s): LIPASE, AMYLASE in the last 168 hours. No results for input(s): AMMONIA in the last 168 hours. Coagulation Profile: No results for input(s): INR, PROTIME in the last 168 hours. Cardiac Enzymes: No results for input(s): CKTOTAL, CKMB, CKMBINDEX, TROPONINI in the last 168 hours. BNP (last 3 results) No results for input(s): PROBNP in the last 8760 hours. HbA1C: No results for input(s): HGBA1C in  the last 72 hours. CBG: No results for input(s): GLUCAP in the last 168 hours. Lipid Profile: No results for input(s): CHOL, HDL, LDLCALC, TRIG, CHOLHDL, LDLDIRECT in the last 72 hours. Thyroid Function Tests: No results for input(s): TSH, T4TOTAL, FREET4, T3FREE, THYROIDAB in the last 72 hours. Anemia Panel: No results for input(s): VITAMINB12, FOLATE, FERRITIN, TIBC, IRON, RETICCTPCT in the last 72 hours. Urine analysis:    Component Value Date/Time   COLORURINE STRAW (A) 01/28/2020 2012   APPEARANCEUR CLEAR 01/28/2020 2012   LABSPEC 1.009 01/28/2020 2012   PHURINE 6.0 01/28/2020 2012   GLUCOSEU NEGATIVE 01/28/2020 2012  HGBUR SMALL (A) 01/28/2020 2012   BILIRUBINUR NEGATIVE 01/28/2020 2012   KETONESUR 5 (A) 01/28/2020 2012   PROTEINUR NEGATIVE 01/28/2020 2012   UROBILINOGEN 0.2 04/17/2014 1819   NITRITE NEGATIVE 01/28/2020 2012   LEUKOCYTESUR NEGATIVE 01/28/2020 2012   Sepsis Labs: @LABRCNTIP (procalcitonin:4,lacticidven:4) )No results found for this or any previous visit (from the past 240 hour(s)).   Radiological Exams on Admission: CT Head Wo Contrast  Result Date: 01/28/2020 CLINICAL DATA:  Status post trauma. EXAM: CT HEAD WITHOUT CONTRAST TECHNIQUE: Contiguous axial images were obtained from the base of the skull through the vertex without intravenous contrast. COMPARISON:  April 17, 2014 FINDINGS: Brain: There is mild cerebral atrophy with widening of the extra-axial spaces and ventricular dilatation. There are areas of decreased attenuation within the white matter tracts of the supratentorial brain, consistent with microvascular disease changes. Vascular: No hyperdense vessel or unexpected calcification. Skull: Normal. Negative for fracture or focal lesion. Sinuses/Orbits: No acute finding. Other: There is moderate to marked severity right occipital scalp soft tissue swelling with associated scalp hematoma. IMPRESSION: 1. Moderate to marked severity right occipital scalp  soft tissue swelling with associated scalp hematoma. 2. No acute intracranial abnormality. Electronically Signed   By: April 19, 2014 M.D.   On: 01/28/2020 19:13    EKG: Independently reviewed.  Normal sinus rhythm.  QTC 466 ms.  Assessment/Plan Principal Problem:   Seizure [facility]) Active Problems:   Electrolyte abnormality   Multiple sclerosis (HCC)   Essential hypertension    1. Seizure -patient has known history of seizures and is on Trileptal.  Discussed with on-call neurologist [facility] to rule who at this time recommended to continue the same dose and seizure might have been precipitated by electrolyte abnormalities including marked hypomagnesium hypokalemia and hypocalcemia.  Patient also has low sodium which could have been chronic given the patient is on sodium tablets.  Neurologist also recommended to check Trileptal levels.  Closely observe. 2. Multiple electrolyte abnormalities likely from diarrhea.  Patient has significant hypomagnesemia hypokalemia and hypocalcemia.  Replace his minute heart rate.  Recheck metabolic panel and closely follow. 3. Hyponatremia likely could be chronic since patient is on sodium chloride tablets should be continued. 4. Multiple sclerosis continue home medications. 5. Hypertension we will hold amlodipine since patient was giving history compatible with orthostatic changes.  We will keep patient on as needed IV hydralazine now check orthostatics in the morning. 6. Diarrhea cause not clear.  Will need stool studies if there is further diarrhea.  Patient states she was not on any antibiotics or sick contacts.  Covid test is negative. 7. Scalp hematoma after fall. 8. Anemia appears to be new.  When compared to labs in Care Everywhere.  Could be from blood loss from the fall.  Follow CBC.   DVT prophylaxis: SCDs since patient has a large scalp hematoma avoiding anticoagulation. Code Status: Full code. Family Communication: Patient's  daughter-in-law. Disposition Plan: Home. Consults called: Discussed with neurologist. Admission status: Observation.   Georgiana Spinner MD [facility] Pager [phone].  If 7PM-7AM, please contact night-coverage [email or web address] Password [facility]  01/28/2020, 10:58 PM

## 2020-01-28 NOTE — ED Notes (Signed)
Hematoma on back right side of head covered with gauze per Dr. Renaye Rakers

## 2020-01-29 ENCOUNTER — Observation Stay (HOSPITAL_COMMUNITY): Payer: Medicare (Managed Care)

## 2020-01-29 DIAGNOSIS — E876 Hypokalemia: Secondary | ICD-10-CM | POA: Diagnosis present

## 2020-01-29 DIAGNOSIS — G40909 Epilepsy, unspecified, not intractable, without status epilepticus: Secondary | ICD-10-CM | POA: Diagnosis present

## 2020-01-29 DIAGNOSIS — Z79899 Other long term (current) drug therapy: Secondary | ICD-10-CM | POA: Diagnosis not present

## 2020-01-29 DIAGNOSIS — F419 Anxiety disorder, unspecified: Secondary | ICD-10-CM | POA: Diagnosis present

## 2020-01-29 DIAGNOSIS — R519 Headache, unspecified: Secondary | ICD-10-CM | POA: Diagnosis present

## 2020-01-29 DIAGNOSIS — R42 Dizziness and giddiness: Secondary | ICD-10-CM

## 2020-01-29 DIAGNOSIS — E871 Hypo-osmolality and hyponatremia: Secondary | ICD-10-CM | POA: Diagnosis present

## 2020-01-29 DIAGNOSIS — G35 Multiple sclerosis: Secondary | ICD-10-CM | POA: Diagnosis present

## 2020-01-29 DIAGNOSIS — Z87891 Personal history of nicotine dependence: Secondary | ICD-10-CM | POA: Diagnosis not present

## 2020-01-29 DIAGNOSIS — Z820 Family history of epilepsy and other diseases of the nervous system: Secondary | ICD-10-CM | POA: Diagnosis not present

## 2020-01-29 DIAGNOSIS — E878 Other disorders of electrolyte and fluid balance, not elsewhere classified: Secondary | ICD-10-CM | POA: Diagnosis not present

## 2020-01-29 DIAGNOSIS — H8110 Benign paroxysmal vertigo, unspecified ear: Secondary | ICD-10-CM | POA: Diagnosis present

## 2020-01-29 DIAGNOSIS — R569 Unspecified convulsions: Secondary | ICD-10-CM

## 2020-01-29 DIAGNOSIS — Z90710 Acquired absence of both cervix and uterus: Secondary | ICD-10-CM | POA: Diagnosis not present

## 2020-01-29 DIAGNOSIS — Z20822 Contact with and (suspected) exposure to covid-19: Secondary | ICD-10-CM | POA: Diagnosis present

## 2020-01-29 DIAGNOSIS — I10 Essential (primary) hypertension: Secondary | ICD-10-CM | POA: Diagnosis present

## 2020-01-29 DIAGNOSIS — D649 Anemia, unspecified: Secondary | ICD-10-CM | POA: Diagnosis present

## 2020-01-29 DIAGNOSIS — H8112 Benign paroxysmal vertigo, left ear: Secondary | ICD-10-CM | POA: Diagnosis not present

## 2020-01-29 LAB — COMPREHENSIVE METABOLIC PANEL
ALT: 18 U/L (ref 0–44)
AST: 26 U/L (ref 15–41)
Albumin: 3.4 g/dL — ABNORMAL LOW (ref 3.5–5.0)
Alkaline Phosphatase: 87 U/L (ref 38–126)
Anion gap: 8 (ref 5–15)
BUN: 8 mg/dL (ref 8–23)
CO2: 22 mmol/L (ref 22–32)
Calcium: 8.4 mg/dL — ABNORMAL LOW (ref 8.9–10.3)
Chloride: 108 mmol/L (ref 98–111)
Creatinine, Ser: 0.61 mg/dL (ref 0.44–1.00)
GFR calc Af Amer: >60 mL/min (ref >60)
GFR calc non Af Amer: >60 mL/min (ref >60)
Glucose, Bld: 89 mg/dL (ref 70–99)
Potassium: 3.8 mmol/L (ref 3.5–5.1)
Sodium: 138 mmol/L (ref 135–145)
Total Bilirubin: 0.7 mg/dL (ref 0.3–1.2)
Total Protein: 6 g/dL — ABNORMAL LOW (ref 6.5–8.1)

## 2020-01-29 LAB — CBC WITH DIFFERENTIAL/PLATELET
Abs Immature Granulocytes: 0.02 10*3/uL (ref 0.00–0.07)
Basophils Absolute: 0.1 10*3/uL (ref 0.0–0.1)
Basophils Relative: 1 %
Eosinophils Absolute: 0.1 10*3/uL (ref 0.0–0.5)
Eosinophils Relative: 1 %
HCT: 34.9 % — ABNORMAL LOW (ref 36.0–46.0)
Hemoglobin: 11.7 g/dL — ABNORMAL LOW (ref 12.0–15.0)
Immature Granulocytes: 0 %
Lymphocytes Relative: 13 %
Lymphs Abs: 1 10*3/uL (ref 0.7–4.0)
MCH: 31.2 pg (ref 26.0–34.0)
MCHC: 33.5 g/dL (ref 30.0–36.0)
MCV: 93.1 fL (ref 80.0–100.0)
Monocytes Absolute: 0.8 10*3/uL (ref 0.1–1.0)
Monocytes Relative: 9 %
Neutro Abs: 6.3 10*3/uL (ref 1.7–7.7)
Neutrophils Relative %: 76 %
Platelets: 349 10*3/uL (ref 150–400)
RBC: 3.75 MIL/uL — ABNORMAL LOW (ref 3.87–5.11)
RDW: 12.9 % (ref 11.5–15.5)
WBC: 8.3 10*3/uL (ref 4.0–10.5)
nRBC: 0 % (ref 0.0–0.2)

## 2020-01-29 LAB — SARS CORONAVIRUS 2 (TAT 6-24 HRS): SARS Coronavirus 2: NEGATIVE

## 2020-01-29 LAB — MAGNESIUM: Magnesium: 1.6 mg/dL — ABNORMAL LOW (ref 1.7–2.4)

## 2020-01-29 LAB — HIV ANTIBODY (ROUTINE TESTING W REFLEX): HIV Screen 4th Generation wRfx: NONREACTIVE

## 2020-01-29 MED ORDER — ACETAMINOPHEN 325 MG PO TABS
650.0000 mg | ORAL_TABLET | Freq: Four times a day (QID) | ORAL | Status: DC | PRN
  Administered 2020-01-29: 650 mg via ORAL
  Filled 2020-01-29: qty 2

## 2020-01-29 MED ORDER — FESOTERODINE FUMARATE ER 8 MG PO TB24
8.0000 mg | ORAL_TABLET | Freq: Every day | ORAL | Status: DC
  Administered 2020-01-29 – 2020-01-30 (×2): 8 mg via ORAL
  Filled 2020-01-29 (×2): qty 1

## 2020-01-29 MED ORDER — AMLODIPINE BESYLATE 10 MG PO TABS
10.0000 mg | ORAL_TABLET | Freq: Every day | ORAL | Status: DC
  Administered 2020-01-29 – 2020-01-30 (×2): 10 mg via ORAL
  Filled 2020-01-29 (×2): qty 1

## 2020-01-29 MED ORDER — KETOROLAC TROMETHAMINE 30 MG/ML IJ SOLN
30.0000 mg | Freq: Once | INTRAMUSCULAR | Status: AC
  Administered 2020-01-29: 30 mg via INTRAVENOUS
  Filled 2020-01-29: qty 1

## 2020-01-29 MED ORDER — HYDROCODONE-ACETAMINOPHEN 5-325 MG PO TABS: 1.0000 | ORAL_TABLET | Freq: Four times a day (QID) | ORAL | Status: DC | PRN

## 2020-01-29 MED ORDER — OXCARBAZEPINE 150 MG PO TABS
150.0000 mg | ORAL_TABLET | Freq: Every day | ORAL | Status: DC
  Administered 2020-01-29: 150 mg via ORAL
  Filled 2020-01-29 (×2): qty 1

## 2020-01-29 MED ORDER — GABAPENTIN 600 MG PO TABS
600.0000 mg | ORAL_TABLET | Freq: Three times a day (TID) | ORAL | Status: DC
  Administered 2020-01-29 – 2020-01-30 (×3): 600 mg via ORAL
  Filled 2020-01-29 (×3): qty 1

## 2020-01-29 MED ORDER — MAGNESIUM SULFATE 4 GM/100ML IV SOLN
4.0000 g | Freq: Once | INTRAVENOUS | Status: AC
  Administered 2020-01-29: 4 g via INTRAVENOUS
  Filled 2020-01-29: qty 100

## 2020-01-29 MED ORDER — PANTOPRAZOLE SODIUM 40 MG PO TBEC
40.0000 mg | DELAYED_RELEASE_TABLET | Freq: Every day | ORAL | Status: DC
  Administered 2020-01-29 – 2020-01-30 (×2): 40 mg via ORAL
  Filled 2020-01-29 (×2): qty 1

## 2020-01-29 MED ORDER — OXCARBAZEPINE 300 MG PO TABS: 300.0000 mg | ORAL_TABLET | Freq: Every day | ORAL | Status: DC

## 2020-01-29 MED ORDER — SODIUM CHLORIDE 0.9 % IV SOLN: INTRAVENOUS | Status: AC

## 2020-01-29 MED ORDER — OXCARBAZEPINE 150 MG PO TABS: 150.0000 mg | ORAL_TABLET | Freq: Every day | ORAL | Status: DC

## 2020-01-29 MED ORDER — POTASSIUM CHLORIDE CRYS ER 20 MEQ PO TBCR
20.0000 meq | EXTENDED_RELEASE_TABLET | Freq: Once | ORAL | Status: AC
  Administered 2020-01-29: 12:00:00 20 meq via ORAL
  Filled 2020-01-29: qty 2

## 2020-01-29 MED ORDER — CYCLOSPORINE 0.05 % OP EMUL
1.0000 [drp] | Freq: Two times a day (BID) | OPHTHALMIC | Status: DC
  Administered 2020-01-29 – 2020-01-30 (×2): 1 [drp] via OPHTHALMIC
  Filled 2020-01-29 (×3): qty 1

## 2020-01-29 MED ORDER — METOCLOPRAMIDE HCL 5 MG/ML IJ SOLN
10.0000 mg | Freq: Once | INTRAMUSCULAR | Status: AC
  Administered 2020-01-29: 10 mg via INTRAVENOUS
  Filled 2020-01-29: qty 2

## 2020-01-29 MED ORDER — OXCARBAZEPINE 300 MG PO TABS
300.0000 mg | ORAL_TABLET | Freq: Every day | ORAL | Status: DC
  Administered 2020-01-30: 300 mg via ORAL
  Filled 2020-01-29: qty 1

## 2020-01-29 NOTE — Consult Note (Addendum)
Neurology Consultation  Reason for Consult: seizure Referring Physician: Dr. Janee Morn  CC: seizure  History is obtained from: Patient and chart  HPI: Tara Wade is a 69 y.o. female with history of seizures, multiple sclerosis, hypertension and anxiety.  Patient tells me she was walking out of a donut shop and the last thing she remembers that she was in the hospital.  EMS reported that the patient exited the store and was found on the ground, initially there was concern for postictal.  When the patient's daughter-in-law arrived on the scene she noted patient was extremely confused.  Currently, the patient is awake alert and oriented.  However she is confused about her medications, and cannot tell me what she is on.    The patient is aware that she is in [place].  States that she does take her medications on time.  She is very nervous about staying in the hospital due to the fact that her daughter is supposed to go back to [place] tomorrow for her job.  In talking with the patient her last seizure was approximately 4 years ago.  She lives in [place] and was visiting her daughter here in [place].  This was confirmed with her daughter on the phone.  Although she did not bring the bottle of Trileptal to the hospital, the daughter who was at the house did find the bottle.  The daughter confirmed that she takes 300 mg Trileptal in the morning and 150 at night.  She also confirmed that she has been taking her medication as directed.   Daughter also confirmed that she was on Keppra multiple years ago, changed to Vimpat, and then was placed on Trileptal.  While in the hospital it was noted that she had a magnesium of 0.7 which is currently being treated.   ED course  Relevant labs include -magnesium 0.7 CT head shows-moderate to marked severity right occipital scalp soft tissue swelling with associated scalp hematoma.  No acute intracranial abnormality.  Past Medical History:  Diagnosis  Date  . Anxiety   . Hypertension   . Multiple sclerosis (HCC)   . Seizures (HCC)     Family History  Problem Relation Age of Onset  . Multiple sclerosis Brother   . Seizures Nephew    Social History:   reports that she quit smoking about 6 years ago. Her smoking use included cigarettes. She has a 25.00 pack-year smoking history. She has never used smokeless tobacco. She reports current alcohol use. She reports that she does not use drugs.  Medications  Current Facility-Administered Medications:  .  acetaminophen (TYLENOL) tablet 650 mg, 650 mg, Oral, Q6H PRN, Opyd, Lavone Neri, MD, 650 mg at 01/29/20 0100 .  busPIRone (BUSPAR) tablet 7.5 mg, 7.5 mg, Oral, BID, Eduard Clos, MD, 7.5 mg at 01/29/20 0919 .  hydrALAZINE (APRESOLINE) injection 10 mg, 10 mg, Intravenous, Q4H PRN, Eduard Clos, MD, 10 mg at 01/29/20 0124 .  LORazepam (ATIVAN) injection 1 mg, 1 mg, Intravenous, Q4H PRN, Eduard Clos, MD .  magnesium sulfate IVPB 4 g 100 mL, 4 g, Intravenous, Once, Rodolph Bong, MD .  OXcarbazepine (TRILEPTAL) tablet 150 mg, 150 mg, Oral, QHS, Ulice Dash, PA-C .  [START ON 01/30/2020] Oxcarbazepine (TRILEPTAL) tablet 300 mg, 300 mg, Oral, Daily, Ulice Dash, PA-C .  QUEtiapine (SEROQUEL XR) 24 hr tablet 400 mg, 400 mg, Oral, Daily, Eduard Clos, MD, 400 mg at 01/29/20 0918 .  sertraline (ZOLOFT) tablet 100 mg,  100 mg, Oral, Daily, Rise Patience, MD, 100 mg at 01/29/20 9924 .  sodium chloride tablet 1 g, 1 g, Oral, BID, Rise Patience, MD, 1 g at 01/29/20 0918 .  venlafaxine XR (EFFEXOR-XR) 24 hr capsule 75 mg, 75 mg, Oral, Q breakfast, Rise Patience, MD, 75 mg at 01/29/20 0918  ROS:   General ROS: Positive for -occipital head pain secondary to fall Psychological ROS: Positive for -anxious ophthalmic ROS: negative for - blurry vision, double vision, eye pain or loss of vision ENT ROS: negative for - epistaxis, nasal discharge, oral  lesions, sore throat, tinnitus or vertigo Respiratory ROS: negative for - cough, hemoptysis, shortness of breath or wheezing Cardiovascular ROS: negative for - chest pain, dyspnea on exertion, edema or irregular heartbeat Gastrointestinal ROS: negative for - abdominal pain, diarrhea, hematemesis, nausea/vomiting or stool incontinence Genito-Urinary ROS: negative for - dysuria, hematuria, incontinence or urinary frequency/urgency Musculoskeletal ROS: negative for - joint swelling or muscular weakness Neurological ROS: as noted in HPI Dermatological ROS: negative for rash and skin lesion changes  Exam: Current vital signs: BP (!) 142/76 (BP Location: Right Arm)   Pulse 71   Temp 98 F (36.7 C) (Oral)   Resp 16   Ht 5\' 3"  (1.6 m)   Wt 47.6 kg   SpO2 98%   BMI 18.60 kg/m  Vital signs in last 24 hours: Temp:  [97.4 F (36.3 C)-98 F (36.7 C)] 98 F (36.7 C) (05/12 0820) Pulse Rate:  [64-99] 71 (05/12 0820) Resp:  [9-27] 16 (05/12 0820) BP: (137-166)/(65-90) 142/76 (05/12 0820) SpO2:  [96 %-99 %] 98 % (05/12 0820) Weight:  [47.6 kg] 47.6 kg (05/11 1736)   Constitutional: Appears well-developed and well-nourished.  Psych: Appears anxious Eyes: No scleral injection HENT: No OP obstrucion Head: Normocephalic.  Cardiovascular: Normal rate and regular rhythm.  Respiratory: Effort normal, non-labored breathing GI: Soft.  No distension. There is no tenderness.  Skin: WDI  Neuro: Mental Status: Patient is awake, alert, oriented to person, place, month, year, and situation. Speech-clear with no aphasia or dysarthria.  Able to name, repeat.  She is able to follow commands without any difficulty. Cranial Nerves: II: Visual Fields are full.  III,IV, VI: EOMI without ptosis or diploplia. Pupils equal, round and reactive to light V: Facial sensation is symmetric to temperature VII: Facial movement is symmetric.  VIII: hearing is intact to voice X: Palat elevates symmetrically XI:  Shoulder shrug is symmetric. XII: tongue is midline without atrophy or fasciculations.  Motor: Tone is normal. Bulk is normal. 5/5 strength was present in all four extremities.  Sensory: Sensation is symmetric to light touch and temperature in the arms and legs. Deep Tendon Reflexes: 2+ and very brisk and symmetric in the biceps and patellae.  Plantars: Toes are downgoing bilaterally.  Cerebellar: FNF and HKS are intact bilaterally  Labs I have reviewed labs in epic and the results pertinent to this consultation are:   CBC    Component Value Date/Time   WBC 8.3 01/29/2020 0706   RBC 3.75 (L) 01/29/2020 0706   HGB 11.7 (L) 01/29/2020 0706   HCT 34.9 (L) 01/29/2020 0706   PLT 349 01/29/2020 0706   MCV 93.1 01/29/2020 0706   MCH 31.2 01/29/2020 0706   MCHC 33.5 01/29/2020 0706   RDW 12.9 01/29/2020 0706   LYMPHSABS 1.0 01/29/2020 0706   MONOABS 0.8 01/29/2020 0706   EOSABS 0.1 01/29/2020 0706   BASOSABS 0.1 01/29/2020 0706    CMP  Component Value Date/Time   NA 138 01/29/2020 0706   K 3.8 01/29/2020 0706   CL 108 01/29/2020 0706   CO2 22 01/29/2020 0706   GLUCOSE 89 01/29/2020 0706   BUN 8 01/29/2020 0706   CREATININE 0.61 01/29/2020 0706   CALCIUM 8.4 (L) 01/29/2020 0706   PROT 6.0 (L) 01/29/2020 0706   ALBUMIN 3.4 (L) 01/29/2020 0706   AST 26 01/29/2020 0706   ALT 18 01/29/2020 0706   ALKPHOS 87 01/29/2020 0706   BILITOT 0.7 01/29/2020 0706   GFRNONAA >60 01/29/2020 0706   GFRAA >60 01/29/2020 0706   Other labs: -Magnesium 0.7    Imaging I have reviewed the images obtained:  CT-scan of the brain-moderate to marked severity right occipital scalp soft tissue swelling with associated scalp hematoma.  No acute intracranial abnormality.  Felicie Morn PA-C [facility] [phone]  M-F  (9:00 am- 5:00 PM)  01/29/2020, 10:12 AM   I have seen the patient reviewed the above note.  She complains of some headache and hit her head with her fall  yesterday.  The headache is holocephalic, suspect secondary to hitting her head.  Assessment:  69 year old female with known history of seizures, starting at age 63, who was seizure-free for multiple years on her current regimen.  She has had a breakthrough seizure in the setting of severe hypomagnesemia, and I favor the magnesium as opposed to medication failure as etiology of her seizures.  As noted above-daughter was contacted and correct dosage of Trileptal was confirmed and also confirmed that patient was taking the medication as prescribed.  Impression: -Provoked seizure secondary to hypomagnesemia  Recommendations: -Correct hypomagnesemia -Continue patient on home dose of Trileptal. -I will give a dose of Toradol and Reglan for headache. -Please call if neurology can be of any further assistance.  Ritta Slot, MD [facility] [phone]  If 7pm- 7am, please page neurology on call as listed in AMION.

## 2020-01-29 NOTE — Progress Notes (Signed)
PT Cancellation Note  Patient Details Name: Tara Wade MRN: [id number] DOB: 09-19-1951   Cancelled Treatment:    Reason Eval/Treat Not Completed: Patient at procedure or test/unavailable - pt going for MRI upon PT arrival to room. PT to check back as schedule allows.  Richrd Sox, PT Acute Rehabilitation Services Pager [phone]  Office [phone]    Tyrone Apple D Despina Hidden 01/29/2020, 3:24 PM

## 2020-01-29 NOTE — Progress Notes (Signed)
PROGRESS NOTE    Tara Wade  YCX:[id number] DOB: 02-07-1951 DOA: 01/28/2020 PCP: Patient, No Pcp Per    Chief Complaint  Patient presents with  . Seizures    Brief Narrative:  HPI per Dr. Raynald Kemp Bainter is a 69 y.o. female with known history of seizure has not had any seizures for last 4 years on Trileptal with history of multiple sclerosis depression anxiety and hypertension who is visiting [place] from [place] has been experiencing diarrhea multiple episodes watery for the last 2 days.  Had gone to the mall today for shopping when patient experienced tonic-clonic seizure hold onto the floor hit her head and had a scalp hematoma.  Patient states since this morning patient has been feeling dizzy on standing.  Denies any fever chills nausea vomiting abdominal pain chest pain or shortness of breath.  Has been compliant with her medications.  ED Course: In the ER CT head did not show any acute except for the scalp hematoma.  EKG shows normal sinus rhythm nonspecific changes.  Patient appears nonfocal in the ER.  Appears mildly postictal.  Labs are remarkable for potassium of 2.7 sodium 131 calcium 7.8 magnesium 1.7 hemoglobin 9.5 Covid test was negative.  Patient was started on her regular dose of Trileptal and patient had potassium magnesium and calcium replacement done.  Patient admitted for further observation.   Assessment & Plan:   Principal Problem:   Seizure ([place]) Active Problems:   Electrolyte abnormality   Multiple sclerosis ([place])   Essential hypertension  1 seizures Patient with known history of seizures on Trileptal.  Felt to be likely provoked secondary to significant electrolyte abnormalities.  Magnesium level at 0.7 on admission.  Patient noted to be hypokalemic with potassium of 2.7.  Head CT done to marked severe ED right occipital scalp soft tissue swelling with associated scalp hematoma.  No acute intracranial abnormality.  Replete potassium.   Neurology consulted and recommending correction of electrolyte abnormalities, continuation of home dose Trileptal.  2.  Headache Secondary to hitting her head.  Patient given a dose of Toradol and Reglan for headache per neurology.  3.  Severe hypomagnesemia/hypokalemia Magnesium on admission was 0.7.  Potassium was 2.7.  Repeat magnesium after repletion this morning at 1.6.  Repeat potassium at 3.8 we will give magnesium sulfate 4 g IV x1, K. Dur 20 mEq p.o. x1.  Repeat labs in the morning.  4.  Dizziness Patient with complaints of dizziness.  Head CT unremarkable.  Check MRI brain.  PT for vestibular evaluation.  Follow.  5.  Chronic hyponatremia Felt to be chronic as patient noted to be on sodium chloride tablets which we will continue.  6.  Multiple sclerosis Stable.  Outpatient follow-up.  7.  Hypertension Resume home regimen Norvasc.    DVT prophylaxis: SCDs Code Status: Full Family Communication" updated son and daughter at bedside. Disposition:   Status is: Observation    Dispo: The patient is from: Home              Anticipated d/c is to: Home              Anticipated d/c date is: Hopefully in 1-2 days.              Patient currently with complaints of dizziness, getting electrolytes repleted, monitoring for further seizures.       Consultants:   Neurology: Dr. Leonel Ramsay 01/29/2020  Procedures:   MRI pending 01/29/2020  CT head 01/28/2020  Antimicrobials:   None   Subjective: Patient laying in bed.  Patient with complaints of dizziness whenever she sits up or stands.  Denies any chest pain or shortness of breath.  Objective: Vitals:   01/28/20 2330 01/29/20 0027 01/29/20 0403 01/29/20 0820  BP: (!) 148/79 (!) 161/88 137/65 (!) 142/76  Pulse: 64 65 80 71  Resp: 13 18 12 16   Temp:  97.6 F (36.4 C) (!) 97.4 F (36.3 C) 98 F (36.7 C)  TempSrc:  Oral Oral Oral  SpO2: 99% 99% 98% 98%  Weight:      Height:        Intake/Output Summary  (Last 24 hours) at 01/29/2020 1157 Last data filed at 01/29/2020 0040 Gross per 24 hour  Intake 240 ml  Output --  Net 240 ml   Filed Weights   01/28/20 1736  Weight: 47.6 kg    Examination:  General exam: Appears calm and comfortable  Respiratory system: Clear to auscultation. Respiratory effort normal. Cardiovascular system: S1 & S2 heard, RRR. No JVD, murmurs, rubs, gallops or clicks. No pedal edema. Gastrointestinal system: Abdomen is nondistended, soft and nontender. No organomegaly or masses felt. Normal bowel sounds heard. Central nervous system: Alert and oriented. No focal neurological deficits. Extremities: Symmetric 5 x 5 power. Skin: No rashes, lesions or ulcers Psychiatry: Judgement and insight appear normal. Mood & affect appropriate.     Data Reviewed: I have personally reviewed following labs and imaging studies  CBC: Recent Labs  Lab 01/28/20 1813 01/29/20 0706  WBC 8.2 8.3  NEUTROABS  --  6.3  HGB 11.5* 11.7*  HCT 35.0* 34.9*  MCV 93.6 93.1  PLT 359 349    Basic Metabolic Panel: Recent Labs  Lab 01/28/20 1813 01/28/20 2000 01/29/20 0706  NA 131*  --  138  K 2.7*  --  3.8  CL 100  --  108  CO2 17*  --  22  GLUCOSE 115*  --  89  BUN 11  --  8  CREATININE 0.94  --  0.61  CALCIUM 7.8*  --  8.4*  MG  --  0.7* 1.6*    GFR: Estimated Creatinine Clearance: 51.3 mL/min (by C-G formula based on SCr of 0.61 mg/dL).  Liver Function Tests: Recent Labs  Lab 01/28/20 1813 01/29/20 0706  AST 27 26  ALT 18 18  ALKPHOS 85 87  BILITOT 0.4 0.7  PROT 6.3* 6.0*  ALBUMIN 4.0 3.4*    CBG: No results for input(s): GLUCAP in the last 168 hours.   Recent Results (from the past 240 hour(s))  SARS CORONAVIRUS 2 (TAT 6-24 HRS) Nasopharyngeal Nasopharyngeal Swab     Status: None   Collection Time: 01/28/20  9:04 PM   Specimen: Nasopharyngeal Swab  Result Value Ref Range Status   SARS Coronavirus 2 NEGATIVE NEGATIVE Final    Comment:  (NOTE) SARS-CoV-2 target nucleic acids are NOT DETECTED. The SARS-CoV-2 RNA is generally detectable in upper and lower respiratory specimens during the acute phase of infection. Negative results do not preclude SARS-CoV-2 infection, do not rule out co-infections with other pathogens, and should not be used as the sole basis for treatment or other patient management decisions. Negative results must be combined with clinical observations, patient history, and epidemiological information. The expected result is Negative. Fact Sheet for Patients: 03/29/20 Fact Sheet for Healthcare Providers: HairSlick.no This test is not yet approved or cleared by the quierodirigir.com FDA and  has been authorized for detection and/or diagnosis  of SARS-CoV-2 by FDA under an Emergency Use Authorization (EUA). This EUA will remain  in effect (meaning this test can be used) for the duration of the COVID-19 declaration under Section 56 4(b)(1) of the Act, 21 U.S.C. section 360bbb-3(b)(1), unless the authorization is terminated or revoked sooner. Performed at [facility], [address], [place], [place] [address]          Radiology Studies: CT Head Wo Contrast  Result Date: 01/28/2020 CLINICAL DATA:  Status post trauma. EXAM: CT HEAD WITHOUT CONTRAST TECHNIQUE: Contiguous axial images were obtained from the base of the skull through the vertex without intravenous contrast. COMPARISON:  April 17, 2014 FINDINGS: Brain: There is mild cerebral atrophy with widening of the extra-axial spaces and ventricular dilatation. There are areas of decreased attenuation within the white matter tracts of the supratentorial brain, consistent with microvascular disease changes. Vascular: No hyperdense vessel or unexpected calcification. Skull: Normal. Negative for fracture or focal lesion. Sinuses/Orbits: No acute finding. Other: There is moderate to marked  severity right occipital scalp soft tissue swelling with associated scalp hematoma. IMPRESSION: 1. Moderate to marked severity right occipital scalp soft tissue swelling with associated scalp hematoma. 2. No acute intracranial abnormality. Electronically Signed   By: Aram Candela M.D.   On: 01/28/2020 19:13        Scheduled Meds: . busPIRone  7.5 mg Oral BID  . OXcarbazepine  150 mg Oral QHS   And  . [START ON 01/30/2020] OXcarbazepine  300 mg Oral Daily  . potassium chloride  20 mEq Oral Once  . QUEtiapine  400 mg Oral Daily  . sertraline  100 mg Oral Daily  . sodium chloride  1 g Oral BID  . venlafaxine XR  75 mg Oral Q breakfast   Continuous Infusions: . sodium chloride    . magnesium sulfate bolus IVPB 4 g (01/29/20 1100)     LOS: 0 days    Time spent: 35 minutes.    Ramiro Harvest, MD [facility]   To contact the attending provider between 7A-7P or the covering provider during after hours 7P-7A, please log into the web site [email or web address] and access using universal [place] password for that web site. If you do not have the password, please call the hospital operator.  01/29/2020, 11:57 AM

## 2020-01-30 DIAGNOSIS — H8112 Benign paroxysmal vertigo, left ear: Secondary | ICD-10-CM

## 2020-01-30 LAB — CBC
HCT: 34.2 % — ABNORMAL LOW (ref 36.0–46.0)
Hemoglobin: 11.1 g/dL — ABNORMAL LOW (ref 12.0–15.0)
MCH: 30.6 pg (ref 26.0–34.0)
MCHC: 32.5 g/dL (ref 30.0–36.0)
MCV: 94.2 fL (ref 80.0–100.0)
Platelets: 328 10*3/uL (ref 150–400)
RBC: 3.63 MIL/uL — ABNORMAL LOW (ref 3.87–5.11)
RDW: 13.2 % (ref 11.5–15.5)
WBC: 8.8 10*3/uL (ref 4.0–10.5)
nRBC: 0 % (ref 0.0–0.2)

## 2020-01-30 LAB — BASIC METABOLIC PANEL
Anion gap: 7 (ref 5–15)
BUN: 10 mg/dL (ref 8–23)
CO2: 20 mmol/L — ABNORMAL LOW (ref 22–32)
Calcium: 8.4 mg/dL — ABNORMAL LOW (ref 8.9–10.3)
Chloride: 108 mmol/L (ref 98–111)
Creatinine, Ser: 0.63 mg/dL (ref 0.44–1.00)
GFR calc Af Amer: >60 mL/min (ref >60)
GFR calc non Af Amer: >60 mL/min (ref >60)
Glucose, Bld: 99 mg/dL (ref 70–99)
Potassium: 4 mmol/L (ref 3.5–5.1)
Sodium: 135 mmol/L (ref 135–145)

## 2020-01-30 LAB — MAGNESIUM: Magnesium: 2 mg/dL (ref 1.7–2.4)

## 2020-01-30 MED ORDER — MECLIZINE HCL 25 MG PO TABS: 25.0000 mg | ORAL_TABLET | Freq: Three times a day (TID) | ORAL | 0 refills | Status: DC | PRN

## 2020-01-30 MED ORDER — LOPERAMIDE HCL 2 MG PO CAPS
4.0000 mg | ORAL_CAPSULE | Freq: Once | ORAL | Status: AC
  Administered 2020-01-30: 4 mg via ORAL
  Filled 2020-01-30: qty 2

## 2020-01-30 MED ORDER — OXCARBAZEPINE 150 MG PO TABS: 150.0000 mg | ORAL_TABLET | Freq: Every day | ORAL | Status: DC

## 2020-01-30 MED ORDER — MECLIZINE HCL 12.5 MG PO TABS: 25.0000 mg | ORAL_TABLET | Freq: Three times a day (TID) | ORAL | Status: DC | PRN

## 2020-01-30 MED ORDER — OXCARBAZEPINE 300 MG PO TABS: 300.0000 mg | ORAL_TABLET | Freq: Every day | ORAL | Status: DC

## 2020-01-30 MED ORDER — LOPERAMIDE HCL 2 MG PO TABS
2.0000 mg | ORAL_TABLET | Freq: Four times a day (QID) | ORAL | 0 refills | Status: DC | PRN
Start: 2020-01-30 — End: 2021-04-01

## 2020-01-30 NOTE — Evaluation (Signed)
Physical Therapy Evaluation Patient Details Name: Tara Wade MRN: [id number] DOB: [birth year] Today's Date: 01/30/2020   History of Present Illness  69 y.o. female with known history of seizure has not had any seizures for last 4 years with history of multiple sclerosis depression anxiety and hypertension who is visiting [place] from [place] has been experiencing diarrhea multiple episodes watery for the last 2 days.  Had gone to the mall today for shopping when patient experienced tonic-clonic seizure hold onto the floor hit her head and had a scalp hematoma. CT head and MRI brain negative for acute changes  Clinical Impression   Pt admitted with above diagnosis. See below for specific BPPV tests/treatments. Pt with both left posterior canalithiasis and left horizontal canalithiasis. She tolerated 3 maneuvers with ?resolution of symptoms. Will need to let her system calm down for at least an hour and then reassess for any further BPPV. Patient very aware of high fall risk if she is having vertigo and is eager to "get this taken care of." Pt currently with functional limitations due to the deficits listed below (see PT Problem List). Pt will benefit from skilled PT to increase their independence and safety with mobility to allow discharge to the venue listed below.       Follow Up Recommendations Outpatient PT for vestibular rehab    Equipment Recommendations  None recommended by PT    Recommendations for Other Services       Precautions / Restrictions Precautions Precautions: Fall Restrictions Weight Bearing Restrictions: No     01/30/20 1123  Symptom Behavior  Subjective history of current problem Reports spinning with supine to sit; sit to stand, sit to supine; lasting seconds  Type of Dizziness  Spinning  Frequency of Dizziness with movement  Duration of Dizziness seconds  Symptom Nature Motion provoked;Positional  Aggravating Factors Sit to stand;Supine to sit   Relieving Factors Head stationary  Progression of Symptoms No change since onset  History of similar episodes none  Positional Testing  Dix-Hallpike Dix-Hallpike Right;Dix-Hallpike Left  Horizontal Canal Testing Horizontal Canal Right;Horizontal Canal Left;Horizontal Canal Right Intensity;Horizontal Canal Left Intensity  Dix-Hallpike Right  Dix-Hallpike Right Duration 0  Dix-Hallpike Right Symptoms No nystagmus  Dix-Hallpike Left  Dix-Hallpike Left Duration 45  Dix-Hallpike Left Symptoms Upbeat, left rotatory nystagmus  Horizontal Canal Right  Horizontal Canal Right Duration 5  Horizontal Canal Right Symptoms Geotrophic  Horizontal Canal Left  Horizontal Canal Left Duration persistent  Horizontal Canal Left Symptoms Ageotrophic  Horizontal Canal Right Intensity  Horizontal Canal Right Intensity Mild  Horizontal Canal Left Intensity  Horizontal Canal Left Intensity Moderate       01/30/20 1128  Vestibular Treatment/Exercise  Vestibular Treatment Provided Canalith Repositioning  Canalith Repositioning Epley Manuever Left;Canal Roll Left   EPLEY MANUEVER LEFT  Number of Reps  1  Overall Response  Improved Symptoms   RESPONSE DETAILS LEFT on retest of Lt hall-pike, noted straight rt beating nystagmus; after horizontal canal treated, again assessed lt hall-pike with no nystagmus/systems  Canal Roll Left  Number of Reps  2  Overall Response  Improved Symptoms  Response Details  1st BBQ roll, pt came up from prone to sit EOB; repeat supine headroll with improved symptoms, however rt beating nystagmus persisted; repeated BBQ roll for left horiz canal and symptoms improved to <2 seconds     Mobility  Bed Mobility Overal bed mobility: Independent             General bed mobility comments: able  to move into positions in bed for vestibular assessment and treatment without assist  Transfers Overall transfer level: Needs assistance Equipment used: None Transfers: Sit to/from  Stand Sit to Stand: Min guard         General transfer comment: stood to scoot up to [facility]; pt guarded anticipating dizziness  Ambulation/Gait                Stairs            Wheelchair Mobility    Modified Rankin (Stroke Patients Only)       Balance                                             Pertinent Vitals/Pain Pain Assessment: Faces Faces Pain Scale: Hurts even more Pain Location: posterior scalp Pain Descriptors / Indicators: Grimacing;Guarding;Sharp Pain Intervention(s): Limited activity within patient's tolerance;Monitored during session;Repositioned    Home Living Family/patient expects to be discharged to:: Private residence Living Arrangements: Children(while visiting here in [place] (lives in [place])) Available Help at Discharge: Family;Available PRN/intermittently                  Prior Function Level of Independence: Independent         Comments: does not use assistive device or adaptive equipment     Hand Dominance        Extremity/Trunk Assessment   Upper Extremity Assessment Upper Extremity Assessment: Defer to OT evaluation    Lower Extremity Assessment Lower Extremity Assessment: Overall WFL for tasks assessed    Cervical / Trunk Assessment Cervical / Trunk Assessment: Normal  Communication   Communication: No difficulties  Cognition Arousal/Alertness: Awake/alert Behavior During Therapy: WFL for tasks assessed/performed Overall Cognitive Status: Within Functional Limits for tasks assessed                                        General Comments      Exercises     Assessment/Plan    PT Assessment Patient needs continued PT services  PT Problem List Decreased activity tolerance;Decreased balance;Decreased mobility       PT Treatment Interventions Gait training;Stair training;Functional mobility training;Therapeutic activities;Therapeutic exercise;Balance  training;Neuromuscular re-education;Patient/family education    PT Goals (Current goals can be found in the Care Plan section)  Acute Rehab PT Goals Patient Stated Goal: to stop feeling dizzy so she won't fall PT Goal Formulation: With patient Time For Goal Achievement: 02/06/20 Potential to Achieve Goals: Good    Frequency Min 4X/week   Barriers to discharge        Co-evaluation               AM-PAC PT "6 Clicks" Mobility  Outcome Measure Help needed turning from your back to your side while in a flat bed without using bedrails?: None Help needed moving from lying on your back to sitting on the side of a flat bed without using bedrails?: A Little Help needed moving to and from a bed to a chair (including a wheelchair)?: A Little Help needed standing up from a chair using your arms (e.g., wheelchair or bedside chair)?: A Little Help needed to walk in hospital room?: A Little Help needed climbing 3-5 steps with a railing? : A Little 6 Click Score: 19  End of Session   Activity Tolerance: Patient tolerated treatment well Patient left: in bed;with call bell/phone within reach;with chair alarm set Nurse Communication: Mobility status;Other (comment)(OK to resume IV ) PT Visit Diagnosis: Unsteadiness on feet (R26.81);BPPV;Dizziness and giddiness (R42) BPPV - Right/Left : Left    Time: 3154-0086 PT Time Calculation (min) (ACUTE ONLY): 58 min   Charges:   PT Evaluation $PT Eval Moderate Complexity: 1 Mod PT Treatments $Self Care/Home Management: 23-37 $Canalith Rep Proc: 8-22 mins         Jerolyn Center, PT Pager [phone]   Zena Amos 01/30/2020, 11:27 AM

## 2020-01-30 NOTE — Progress Notes (Signed)
Pt home DME walker was not delivered to pt at bedside. ED Case manager notified and she said walker will be delivered to pt home tomorrow. Pt refuses to go home without a walker, ortho tech notified but per staff, walker not available and pt can receive it through PT. Unit director Angelique Blonder notified of the situation and given the approval to discharge pt home with unit walker in pt room. Pt room walker cleaned and handed to pt with family in room as witness. Pt transported off unit via wheelchair with belongings including walker and family to the side. Pt discharge home with family to transport her off to disposition. Dionne Bucy RN

## 2020-01-30 NOTE — Discharge Summary (Signed)
Physician Discharge Summary  Tara Wade YQM:[id number] DOB: 1951-04-22 DOA: 01/28/2020  PCP: Patient, No Pcp Per  Admit date: 01/28/2020 Discharge date: 01/30/2020  Time spent: 50 minutes  Recommendations for Outpatient Follow-up:  1. Follow-up outpatient PT for vestibular rehab. 2. Follow-up with PCP in 2 to 3 weeks.  On follow-up patient will need a basic metabolic profile done to follow-up on electrolytes, renal function.  Patient also needs a magnesium level checked.   Discharge Diagnoses:  Principal Problem:   Seizure (HCC) Active Problems:   Electrolyte abnormality   Multiple sclerosis (HCC)   Essential hypertension   Hypokalemia   Hypomagnesemia   Dizziness   Nonintractable headache   BPPV (benign paroxysmal positional vertigo), left   Discharge Condition: Stable and improved  Diet recommendation: Heart healthy  Filed Weights   01/28/20 1736  Weight: 47.6 kg    History of present illness:  HPI per Dr. Ward Chatters Tara Wade is a 68 y.o. female with known history of seizure has not had any seizures for last 4 years on Trileptal with history of multiple sclerosis depression anxiety and hypertension who is visiting [place] from [place] has been experiencing diarrhea multiple episodes watery for the last 2 days.  Had gone to the mall on day of admission, for shopping when patient experienced tonic-clonic seizure hold onto the floor hit her head and had a scalp hematoma.  Patient states since this morning patient has been feeling dizzy on standing.  Denies any fever chills nausea vomiting abdominal pain chest pain or shortness of breath.  Has been compliant with her medications.  ED Course: In the ER CT head did not show any acute except for the scalp hematoma.  EKG shows normal sinus rhythm nonspecific changes.  Patient appears nonfocal in the ER.  Appears mildly postictal.  Labs are remarkable for potassium of 2.7 sodium 131 calcium 7.8 magnesium 1.7 hemoglobin  9.5 Covid test was negative.  Patient was started on her regular dose of Trileptal and patient had potassium magnesium and calcium replacement done.  Patient admitted for further observation.  Hospital Course:  1 seizures Patient with known history of seizures on Trileptal.  Felt to be likely provoked secondary to significant electrolyte abnormalities.  Magnesium level at 0.7 on admission.  Patient noted to be hypokalemic with potassium of 2.7.  Head CT done on admission with moderate to marked severity right occipital scalp soft tissue swelling with associated scalp hematoma.  No acute intracranial abnormality.    Patient's electrolytes were repleted aggressively.  Neurology consulted and recommended correction of electrolyte abnormalities, continuation of home dose Trileptal and no further work-up at this time.  Patient had no further seizure-like episodes during the hospitalization.  Outpatient follow-up..  2.  Headache Secondary to hitting her head.  Patient given a dose of Toradol and Reglan for headache per neurology with clinical improvement.  3.  Severe hypomagnesemia/hypokalemia Secondary to GI losses.  Magnesium on admission was 0.7.  Potassium was 2.7.    Patient's electrolytes were repleted aggressively such that by day of discharge patient's magnesium was up to 2.0 with a potassium of 4.  Outpatient follow-up with PCP.  4.    BPPV left Patient with complaints of dizziness prior to admission which was ongoing.  Patient had a sensation of a spinning sensation..  Head CT unremarkable.    MRI brain unremarkable.  Orthostatics which were done were negative.  Patient was assessed by PT and vestibular evaluation which was done was positive for  BPPV.  Patient underwent Dix-Hallpike maneuver as well as Epley's maneuver with clinical improvement.  Patient will be discharged home in stable and improved condition on meclizine as needed.  Patient will follow up in outpatient PT for vestibular rehab.   Patient will be discharged in stable and improved condition.  5.  Chronic hyponatremia Felt to be chronic as patient noted to be on sodium chloride tablets prior to admission which patient was maintained on.  6.  Multiple sclerosis Outpatient follow-up.  7.  Hypertension Patient was placed back on home regimen of Norvasc during the hospitalization for better blood pressure control.    Procedures:  MRI 01/29/2020  CT head 01/28/2020    Consultations:  Neurology: Dr. Amada Jupiter 01/29/2020  Discharge Exam: Vitals:   01/30/20 0700 01/30/20 1245  BP: 120/78 (!) 151/76  Pulse: 97 74  Resp: 16 16  Temp: 97.7 F (36.5 C) 98 F (36.7 C)  SpO2: 98% 97%    General: NAD Cardiovascular: RRR Respiratory: CTAB  Discharge Instructions   Discharge Instructions    Ambulatory referral to Occupational Therapy   Complete by: As directed    Ambulatory referral to Physical Therapy   Complete by: As directed    Vestibular therapy   Diet - low sodium heart healthy   Complete by: As directed    Increase activity slowly   Complete by: As directed      Allergies as of 01/30/2020   No Known Allergies     Medication List    STOP taking these medications   levETIRAcetam 500 MG tablet Commonly known as: Keppra     TAKE these medications   albuterol 108 (90 Base) MCG/ACT inhaler Commonly known as: VENTOLIN HFA Inhale 2 puffs into the lungs every 6 (six) hours as needed for wheezing or shortness of breath.   ALPRAZolam 0.5 MG tablet Commonly known as: XANAX Take 0.5 mg by mouth 3 (three) times daily as needed for anxiety.   amLODipine 10 MG tablet Commonly known as: NORVASC Take 10 mg by mouth daily.   Aricept 10 MG tablet Generic drug: donepezil Take 10 mg by mouth at bedtime.   Biotin 1000 MCG tablet Take 1,000 mcg by mouth in the morning and at bedtime.   busPIRone 7.5 MG tablet Commonly known as: BUSPAR Take 7.5 mg by mouth 2 (two) times daily.    loperamide 2 MG tablet Commonly known as: Imodium A-D Take 1 tablet (2 mg total) by mouth 4 (four) times daily as needed for diarrhea or loose stools.   meclizine 25 MG tablet Commonly known as: ANTIVERT Take 1 tablet (25 mg total) by mouth 3 (three) times daily as needed for dizziness.   modafinil 200 MG tablet Commonly known as: PROVIGIL Take 200 mg by mouth every morning.   Neurontin 600 MG tablet Generic drug: gabapentin Take 600 mg by mouth in the morning, at noon, and at bedtime.   omeprazole 20 MG capsule Commonly known as: PRILOSEC Take 20 mg by mouth in the morning and at bedtime.   OXcarbazepine 150 MG tablet Commonly known as: TRILEPTAL Take 1 tablet (150 mg total) by mouth at bedtime.   Oxcarbazepine 300 MG tablet Commonly known as: TRILEPTAL Take 1 tablet (300 mg total) by mouth daily. Start taking on: Jan 31, 2020   QUEtiapine 400 MG 24 hr tablet Commonly known as: SEROQUEL XR Take 400 mg by mouth daily.   Restasis 0.05 % ophthalmic emulsion Generic drug: cycloSPORINE Place 1 drop into both eyes  2 (two) times daily.   sertraline 100 MG tablet Commonly known as: ZOLOFT Take 100 mg by mouth daily.   sodium chloride 1 g tablet Take 1 g by mouth 2 (two) times daily.   tolterodine 4 MG 24 hr capsule Commonly known as: DETROL LA Take 4 mg by mouth daily.   traZODone 100 MG tablet Commonly known as: DESYREL Take 100 mg by mouth at bedtime.   venlafaxine XR 75 MG 24 hr capsule Commonly known as: EFFEXOR-XR Take 75 mg by mouth daily.      No Known Allergies Follow-up Information    [facility]   Specialty: Rehabilitation Why: The outpatient therapy will contact you for the first appointment Contact information: [address] [id number] [place] [address] [phone]       pcp. Schedule an appointment as soon as possible for a visit in 2 week(s).   Why: f/u in  2-3 weeks.           The results of significant diagnostics from this hospitalization (including imaging, microbiology, ancillary and laboratory) are listed below for reference.    Significant Diagnostic Studies: CT Head Wo Contrast  Result Date: 01/28/2020 CLINICAL DATA:  Status post trauma. EXAM: CT HEAD WITHOUT CONTRAST TECHNIQUE: Contiguous axial images were obtained from the base of the skull through the vertex without intravenous contrast. COMPARISON:  April 17, 2014 FINDINGS: Brain: There is mild cerebral atrophy with widening of the extra-axial spaces and ventricular dilatation. There are areas of decreased attenuation within the white matter tracts of the supratentorial brain, consistent with microvascular disease changes. Vascular: No hyperdense vessel or unexpected calcification. Skull: Normal. Negative for fracture or focal lesion. Sinuses/Orbits: No acute finding. Other: There is moderate to marked severity right occipital scalp soft tissue swelling with associated scalp hematoma. IMPRESSION: 1. Moderate to marked severity right occipital scalp soft tissue swelling with associated scalp hematoma. 2. No acute intracranial abnormality. Electronically Signed   By: Aram Candela M.D.   On: 01/28/2020 19:13   MR BRAIN WO CONTRAST  Result Date: 01/29/2020 CLINICAL DATA:  Seizure, previously seizure free of medication, history of multiple sclerosis EXAM: MRI HEAD WITHOUT CONTRAST TECHNIQUE: Multiplanar, multiecho pulse sequences of the brain and surrounding structures were obtained without intravenous contrast. COMPARISON:  None. FINDINGS: Brain: There is no acute infarction or intracranial hemorrhage. There is no intracranial mass, mass effect, or edema. There is no hydrocephalus or extra-axial fluid collection. Patchy and confluent areas of T2 hyperintensity in the supratentorial and pontine white matter is nonspecific but probably reflects moderate chronic microvascular ischemic changes.  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 Vascular: Major vessel flow voids at the skull base are preserved. Skull and upper cervical spine: Normal marrow signal is preserved. Sinuses/Orbits: Mild mucosal thickening. Bilateral lens replacements. Other: Sella is unremarkable. Mastoid air cells are clear. Right posterior parietal scalp soft tissue swelling. IMPRESSION: No evidence of recent infarction, hemorrhage, or mass. Moderate chronic microvascular ischemic changes. Electronically Signed   By: Guadlupe Spanish M.D.   On: 01/29/2020 16:38    Microbiology: Recent Results (from the past 240 hour(s))  SARS CORONAVIRUS 2 (TAT 6-24 HRS) Nasopharyngeal Nasopharyngeal Swab     Status: None   Collection Time: 01/28/20  9:04 PM   Specimen: Nasopharyngeal Swab  Result Value Ref Range Status   SARS Coronavirus 2 NEGATIVE NEGATIVE Final    Comment: (NOTE) SARS-CoV-2 target nucleic acids are NOT DETECTED.  The SARS-CoV-2 RNA is generally detectable in upper and lower respiratory specimens during the acute phase of infection. Negative results do not preclude SARS-CoV-2 infection, do not rule out co-infections with other pathogens, and should not be used as the sole basis for treatment or other patient management decisions. Negative results must be combined with clinical observations, patient history, and epidemiological information. The expected result is Negative. Fact Sheet for Patients: SugarRoll.be Fact Sheet for Healthcare Providers: https://www.woods-mathews.com/ This test is not yet approved or cleared by the [place] FDA and  has been authorized for detection and/or diagnosis of SARS-CoV-2 by FDA under an Emergency Use Authorization (EUA). This EUA will remain  in effect (meaning this test can be used) for the duration of the COVID-19 declaration under Section 56 4(b)(1) of the Act, 21 U.S.C. section  360bbb-3(b)(1), unless the authorization is terminated or revoked sooner. Performed at [facility], [place] [address], [place], [place] [address]      Labs: Basic Metabolic Panel: Recent Labs  Lab 01/28/20 1813 01/28/20 2000 01/29/20 0706 01/30/20 0429  NA 131*  --  138 135  K 2.7*  --  3.8 4.0  CL 100  --  108 108  CO2 17*  --  22 20*  GLUCOSE 115*  --  89 99  BUN 11  --  8 10  CREATININE 0.94  --  0.61 0.63  CALCIUM 7.8*  --  8.4* 8.4*  MG  --  0.7* 1.6* 2.0   Liver Function Tests: Recent Labs  Lab 01/28/20 1813 01/29/20 0706  AST 27 26  ALT 18 18  ALKPHOS 85 87  BILITOT 0.4 0.7  PROT 6.3* 6.0*  ALBUMIN 4.0 3.4*   No results for input(s): LIPASE, AMYLASE in the last 168 hours. No results for input(s): AMMONIA in the last 168 hours. CBC: Recent Labs  Lab 01/28/20 1813 01/29/20 0706 01/30/20 0429  WBC 8.2 8.3 8.8  NEUTROABS  --  6.3  --   HGB 11.5* 11.7* 11.1*  HCT 35.0* 34.9* 34.2*  MCV 93.6 93.1 94.2  PLT 359 349 328   Cardiac Enzymes: No results for input(s): CKTOTAL, CKMB, CKMBINDEX, TROPONINI in the last 168 hours. BNP: BNP (last 3 results) No results for input(s): BNP in the last 8760 hours.  ProBNP (last 3 results) No results for input(s): PROBNP in the last 8760 hours.  CBG: No results for input(s): GLUCAP in the last 168 hours.     Signed:  Irine Seal MD.  [facility] 01/30/2020, 3:16 PM

## 2020-01-30 NOTE — TOC Transition Note (Signed)
Transition of Care [facility]) - CM/SW Discharge Note   Patient Details  Name: Tara Wade MRN: [id number] Date of Birth: Mar 03, 1951  [facility] CM/SW Contact:  Kermit Balo, RN Phone Number: 01/30/2020, 4:03 PM   Clinical Narrative:    Pt discharging home with family. Outpatient therapy arranged through [facility].  Rolling walker to be delivered to the room per [employer].  Pt has her seizure medications for home at the bedside.  Pt has supervision at home and transportation to home.    Final next level of care: OP Rehab Barriers to Discharge: No Barriers Identified   Patient Goals and CMS Choice     Choice offered to / list presented to : Patient, Adult Children  Discharge Placement                       Discharge Plan and Services   Discharge Planning Services: CM Consult            DME Arranged: Walker rolling DME Agency: [employer] Date DME Agency Contacted: 01/30/20   Representative spoke with at DME Agency: Zack            Social Determinants of Health (SDOH) Interventions     Readmission Risk Interventions No flowsheet data found.

## 2020-01-30 NOTE — Progress Notes (Signed)
Pt home medication picked up from pharmacy and delivered to pt at bedside upon pt pending discharge. Pt assigned RN notified. Dionne Bucy RN

## 2020-01-30 NOTE — Progress Notes (Signed)
Physical Therapy Note  PT Vestibular evaluation completed with full note to follow. Pt with +left posterior AND horizontal canal BPPV. Epley maneuver and BBQ roll maneuvers completed. Will reassess for need for further maneuvers in ~ 1 hour (allow her vestibular system to settle/calm).    Jerolyn Center, PT Pager [phone]

## 2020-01-30 NOTE — Progress Notes (Signed)
Discharge instructions reviewed with.  All questions answered.  Daughter and pt in room,.  IV removed.

## 2020-01-30 NOTE — Progress Notes (Signed)
Physical Therapy Treatment Patient Details Name: Tara Wade MRN: [id number] DOB: August 19, 1951 Today's Date: 01/30/2020    History of Present Illness 69 y.o. female with known history of seizure has not had any seizures for last 4 years with history of multiple sclerosis depression anxiety and hypertension who is visiting [place] from [place] has been experiencing diarrhea multiple episodes watery for the last 2 days.  Had gone to the mall today for shopping when patient experienced tonic-clonic seizure hold onto the floor hit her head and had a scalp hematoma. CT head and MRI brain negative for acute changes. 5/13 pt/daughter-in-law now report pt had another hard fall 2 weeks ago with LOC (pt was trying to stand up on kitchen counter and fell backwards).     PT Comments    Patient seen for 3rd session to assess ambulation. She was seen in hallway with OT earlier in the afternoon and was very unsteady and reporting dizziness. With PT, she remains unsteady (see gait details below), however denied vertigo. She reports she feels "floatey" and feels like she is walking on something soft, making her unsteady. Her gait deviations cannot be explained by BPPV (which now appears to have cleared with second session). She has been opposed to using a cane (she admits her family and doctor have been telling her for years to use one) but begrudgingly agrees to use a RW with primary goal to prevent another fall and hitting her head. She is agreeable to OPPT and starting this while she remains in [place] x 3 weeks.     Follow Up Recommendations  Outpatient PT(vestibular rehab/balance)     Equipment Recommendations  Rolling walker with 5" wheels    Recommendations for Other Services       Precautions / Restrictions Precautions Precautions: Fall Restrictions Weight Bearing Restrictions: No    Mobility  Bed Mobility Overal bed mobility: Independent             General bed mobility  comments: able to move into positions in bed for vestibular assessment and treatment without assist  Transfers Overall transfer level: Needs assistance Equipment used: None Transfers: Sit to/from Stand Sit to Stand: Min guard Stand pivot transfers: Mod assist       General transfer comment: from bed; denied vertigo but slightly unsteady needing a moment to re-group  Ambulation/Gait Ambulation/Gait assistance: Min assist Gait Distance (Feet): 100 Feet Assistive device: None Gait Pattern/deviations: Step-through pattern;Decreased step length - left;Decreased dorsiflexion - left;Drifts right/left;Narrow base of support Gait velocity: decr, guarded   General Gait Details: pt with sensation of LLE being longer than RLE and is walking toe to heel with LLE (at times not even putting Lt heel on the floor); she could partially correct to heel to toe on LLE with max cues; unsteady but not having vertigo, even with 180 degree turns left and right   Stairs             Wheelchair Mobility    Modified Rankin (Stroke Patients Only)       Balance Overall balance assessment: Needs assistance Sitting-balance support: Feet supported;No upper extremity supported Sitting balance-Leahy Scale: Fair     Standing balance support: No upper extremity supported Standing balance-Leahy Scale: Poor Standing balance comment: fair static, poor dynamic                            Cognition Arousal/Alertness: Awake/alert Behavior During Therapy: WFL for tasks assessed/performed Overall Cognitive  Status: Within Functional Limits for tasks assessed                                 General Comments: pt able to state she knows she should not have climbed onto kitchen counter 2 weeks ago when she had a fall with +LOC      Exercises      General Comments General comments (skin integrity, edema, etc.): provided handouts for BPPV and unilateral hypofunction (she only had  BPPV for 2 days, however if symptoms with head turns persist, she may have an underlying hypofunction (OT provided post-concussion handout as pt had fall 2 weeks prior with LOC).       Pertinent Vitals/Pain Pain Assessment: Faces Faces Pain Scale: Hurts a little bit Pain Location: posterior scalp Pain Descriptors / Indicators: Grimacing;Guarding;Sharp Pain Intervention(s): Limited activity within patient's tolerance    Home Living Family/patient expects to be discharged to:: Private residence Living Arrangements: Children(while visiting here in [place] (lives in [place])) Available Help at Discharge: Family;Available PRN/intermittently           Additional Comments: currently visiting adult children in [place], from [place]    Prior Function Level of Independence: Independent      Comments: does not use assistive device or adaptive equipment   PT Goals (current goals can now be found in the care plan section) Acute Rehab PT Goals Patient Stated Goal: to stop feeling dizzy so she won't fall PT Goal Formulation: With patient Time For Goal Achievement: 02/06/20 Potential to Achieve Goals: Good Progress towards PT goals: Progressing toward goals    Frequency    Min 4X/week      PT Plan Current plan remains appropriate    Co-evaluation              AM-PAC PT "6 Clicks" Mobility   Outcome Measure  Help needed turning from your back to your side while in a flat bed without using bedrails?: None Help needed moving from lying on your back to sitting on the side of a flat bed without using bedrails?: None Help needed moving to and from a bed to a chair (including a wheelchair)?: A Little Help needed standing up from a chair using your arms (e.g., wheelchair or bedside chair)?: A Little Help needed to walk in hospital room?: A Little Help needed climbing 3-5 steps with a railing? : A Little 6 Click Score: 20    End of Session Equipment Utilized During Treatment: Gait  belt Activity Tolerance: Patient tolerated treatment well Patient left: with call bell/phone within reach;in bed;with bed alarm set Nurse Communication: Mobility status PT Visit Diagnosis: Unsteadiness on feet (R26.81);BPPV;Dizziness and giddiness (R42) BPPV - Right/Left : Left     Time: 0960-4540 PT Time Calculation (min) (ACUTE ONLY): 22 min  Charges:  $Gait Training: 8-22 mins $Neuromuscular Re-education: 8-22 mins $Self Care/Home Management: 8-22                      Arby Barrette, PT Pager [phone]    Rexanne Mano 01/30/2020, 4:12 PM

## 2020-01-30 NOTE — Evaluation (Addendum)
Occupational Therapy Evaluation Patient Details Name: Tara Wade MRN: [id number] DOB: 1950-10-21 Today's Date: 01/30/2020    History of Present Illness 69 y.o. female with known history of seizure has not had any seizures for last 4 years with history of multiple sclerosis depression anxiety and hypertension who is visiting [place] from [place] has been experiencing diarrhea multiple episodes watery for the last 2 days.  Had gone to the mall today for shopping when patient experienced tonic-clonic seizure hold onto the floor hit her head and had a scalp hematoma. CT head and MRI brain negative for acute changes   Clinical Impression   Pt admitted with the above diagnoses and presents with below problem list. Pt will benefit from continued acute OT to address the below listed deficits and maximize independence with basic ADLs prior to d/c home. At baseline, pt reports she is independent with ADLs, from [place] and currently visiting her children. Pt presents with unsteadiness and increased dizziness while ambulating. She is currently min A with functional mobility, min guard with LB ADLs, setup to supervision with UB ADLs. Daughter-in-law present throughout session.  Of note, pt noted to have a tendency to hold L heel off ground often during mobility. Discussed OT recommendation that pt stay at least an additional night due to increased symptomology and unsteadiness ambulating despite her eagerness to d/c home "tonight."     Follow Up Recommendations  Home health OT;Supervision/Assistance - 24 hour(initial 24 hr )    Equipment Recommendations  Other (comment)(TBD)    Recommendations for Other Services       Precautions / Restrictions Precautions Precautions: Fall Restrictions Weight Bearing Restrictions: No      Mobility Bed Mobility Overal bed mobility: Independent             General bed mobility comments: able to move into positions in bed for vestibular assessment  and treatment without assist  Transfers Overall transfer level: Needs assistance Equipment used: None Transfers: Sit to/from Stand Sit to Stand: Min guard         General transfer comment: to/from recliner    Balance Overall balance assessment: Needs assistance Sitting-balance support: Feet supported;No upper extremity supported Sitting balance-Leahy Scale: Fair     Standing balance support: No upper extremity supported Standing balance-Leahy Scale: Poor Standing balance comment: fair static, poor dynamic                           ADL either performed or assessed with clinical judgement   ADL Overall ADL's : Needs assistance/impaired Eating/Feeding: Set up;Sitting   Grooming: Set up;Sitting;Standing;Min guard   Upper Body Bathing: Set up;Sitting   Lower Body Bathing: Min guard;Sit to/from stand   Upper Body Dressing : Set up;Sitting   Lower Body Dressing: Min guard;Sit to/from stand   Toilet Transfer: Min guard;Minimal assistance   Toileting- Architect and Hygiene: Min guard;Sitting/lateral lean;Sit to/from stand   Tub/ Engineer, structural: Min guard;Ambulation;Minimal assistance   Functional mobility during ADLs: Minimal assistance General ADL Comments: Pt with unsteady noted while completing household distance functional mobility. Pt reports wosrening of symptoms and visibly decreased balance after R turn in hall. Pt eager to d/c home "tonight" discussed OT recommendation to stay at least another night  due to unsteadiness and increased symptomology while ambulating.      Vision         Perception     Praxis      Pertinent Vitals/Pain Pain Assessment: Faces  Faces Pain Scale: Hurts little more Pain Location: posterior scalp Pain Descriptors / Indicators: Grimacing;Guarding;Sharp Pain Intervention(s): Limited activity within patient's tolerance;Monitored during session;Repositioned     Hand Dominance     Extremity/Trunk Assessment  Upper Extremity Assessment Upper Extremity Assessment: Overall WFL for tasks assessed   Lower Extremity Assessment Lower Extremity Assessment: Defer to PT evaluation   Cervical / Trunk Assessment Cervical / Trunk Assessment: Normal   Communication Communication Communication: No difficulties   Cognition Arousal/Alertness: Awake/alert Behavior During Therapy: Flat affect;WFL for tasks assessed/performed Overall Cognitive Status: Within Functional Limits for tasks assessed                                 General Comments: impulsive to walk once in standing despite cues for need for gait belt. baseline high tolerance for risk?   General Comments       Exercises     Shoulder Instructions      Home Living Family/patient expects to be discharged to:: Private residence Living Arrangements: Children(while visiting here in [place] (lives in [place])) Available Help at Discharge: Family;Available PRN/intermittently                             Additional Comments: currently visiting adult children in , from [place]      Prior Functioning/Environment Level of Independence: Independent        Comments: does not use assistive device or adaptive equipment        OT Problem List: Impaired balance (sitting and/or standing);Decreased knowledge of use of DME or AE;Decreased knowledge of precautions;Pain;Decreased activity tolerance      OT Treatment/Interventions: Self-care/ADL training;Therapeutic exercise;Neuromuscular education;DME and/or AE instruction;Energy conservation;Therapeutic activities;Balance training;Patient/family education    OT Goals(Current goals can be found in the care plan section) Acute Rehab OT Goals Patient Stated Goal: to stop feeling dizzy so she won't fall OT Goal Formulation: With patient/family Time For Goal Achievement: 02/13/20 Potential to Achieve Goals: Good ADL Goals Pt Will Perform Grooming: with modified  independence;standing Pt Will Perform Upper Body Bathing: with set-up;sitting Pt Will Perform Lower Body Bathing: with modified independence;sit to/from stand Pt Will Transfer to Toilet: with modified independence;ambulating Pt Will Perform Toileting - Clothing Manipulation and hygiene: with modified independence;sit to/from stand Pt Will Perform Tub/Shower Transfer: with modified independence;ambulating  OT Frequency: Min 2X/week   Barriers to D/C:            Co-evaluation              AM-PAC OT "6 Clicks" Daily Activity     Outcome Measure Help from another person eating meals?: None Help from another person taking care of personal grooming?: None Help from another person toileting, which includes using toliet, bedpan, or urinal?: A Little Help from another person bathing (including washing, rinsing, drying)?: A Little Help from another person to put on and taking off regular upper body clothing?: None Help from another person to put on and taking off regular lower body clothing?: A Little 6 Click Score: 21   End of Session Equipment Utilized During Treatment: Gait belt Nurse Communication: Mobility status;Other (comment)(imcreased dizziness while ambulating)  Activity Tolerance: Other (comment)(dizzy and unsteady while ambulating) Patient left: in chair;with call bell/phone within reach;with family/visitor present  OT Visit Diagnosis: Unsteadiness on feet (R26.81);History of falling (Z91.81);Pain;Other symptoms and signs involving the nervous system (R29.898)  Time: 1345-1410 OT Time Calculation (min): 25 min Charges:  OT General Charges $OT Visit: 1 Visit OT Evaluation $OT Eval Low Complexity: 1 Low OT Treatments $Self Care/Home Management : 8-22 mins  Tyrone Schimke, OT Acute Rehabilitation Services Pager: [phone] Office: [phone]   Hortencia Pilar 01/30/2020, 2:33 PM

## 2020-01-30 NOTE — Progress Notes (Signed)
Physical Therapy Treatment  Patient reported she was having vertigo when turning her head left or right. No longer spinning, but things "shift back and forth, left to right.   Reassessed supine head roll left ageotropic (right-beating) nystagmus and +symptomatic x 30 seconds. Supine head roll to the right pt with a "blip" of vertigo (no nystagmus observed). Due to earlier presentation combined with this assessment, treated patient for left horizontal canalithiasis via modified [place] lying onto her left side. Afterwards, assisted pt to recliner and pt with significant LOB requiring mod assist to pivot to the chair due to imbalance. Instructed pt to stay sitting upright and avoid quick head turns x 1 hour minimum.   Plan to return to assess gait if able to see pt a 3rd time for goal of ?discharge today.      01/30/20 1238  PT Visit Information  Last PT Received On 01/30/20  Assistance Needed +1  History of Present Illness 69 y.o. female with known history of seizure has not had any seizures for last 4 years with history of multiple sclerosis depression anxiety and hypertension who is visiting [place] from [place] has been experiencing diarrhea multiple episodes watery for the last 2 days.  Had gone to the mall today for shopping when patient experienced tonic-clonic seizure hold onto the floor hit her head and had a scalp hematoma. CT head and MRI brain negative for acute changes  Subjective Data  Patient Stated Goal to stop feeling dizzy so she won't fall  Precautions  Precautions Fall  Restrictions  Weight Bearing Restrictions No  Pain Assessment  Pain Assessment Faces  Faces Pain Scale 4  Pain Location posterior scalp  Pain Descriptors / Indicators Grimacing;Guarding;Sharp  Pain Intervention(s) Limited activity within patient's tolerance;Monitored during session;Patient requesting pain meds-RN notified  Cognition  Arousal/Alertness Awake/alert  Behavior During Therapy WFL for tasks  assessed/performed  Overall Cognitive Status Within Functional Limits for tasks assessed  Bed Mobility  Overal bed mobility Independent  General bed mobility comments closeguarding for safety with supine to sit; no vertigo, no physical assist  Transfers  Overall transfer level Needs assistance  Equipment used None  Transfers Stand Pivot Transfers  Stand pivot transfers Mod assist  General transfer comment from bed to recliner after vestibular treatment; very off balance exacerbated by pt reaching for armrest and lost balance  Ambulation/Gait  General Gait Details unable due to dizziness  Balance  Overall balance assessment Needs assistance  Sitting-balance support Feet supported;No upper extremity supported  Sitting balance-Leahy Scale Fair  Standing balance support No upper extremity supported  Standing balance-Leahy Scale Poor  General Comments  General comments (skin integrity, edema, etc.) x  PT - End of Session  Activity Tolerance Patient tolerated treatment well  Patient left with call bell/phone within reach;in chair;with family/visitor present  Nurse Communication Mobility status   PT - Assessment/Plan  PT Plan Equipment recommendations need to be updated  PT Visit Diagnosis Unsteadiness on feet (R26.81);BPPV;Dizziness and giddiness (R42)  BPPV - Right/Left  Left  PT Frequency (ACUTE ONLY) Min 4X/week  Follow Up Recommendations Outpatient PT (vestibular rehab/balance)  PT equipment Rolling walker with 5" wheels (will further assess if able to see for 3rd session today)  AM-PAC PT "6 Clicks" Mobility Outcome Measure (Version 2)  Help needed turning from your back to your side while in a flat bed without using bedrails? 4  Help needed moving from lying on your back to sitting on the side of a flat bed without using  bedrails? 4  Help needed moving to and from a bed to a chair (including a wheelchair)? 2  Help needed standing up from a chair using your arms (e.g., wheelchair  or bedside chair)? 2  Help needed to walk in hospital room? 1  Help needed climbing 3-5 steps with a railing?  1  6 Click Score 14  Consider Recommendation of Discharge To: CIR/SNF/LTACH  Acute Rehab PT Goals  PT Goal Formulation With patient  Time For Goal Achievement 02/06/20  Potential to Achieve Goals Good   1 PT visit 12:08-12:40 pm 1 Neuromusc Re-ed, 1self care Jerolyn Center, PT Pager [phone]

## 2020-01-30 NOTE — TOC Initial Note (Signed)
Transition of Care [facility]) - Initial/Assessment Note    Patient Details  Name: Tara Wade MRN: [id number] Date of Birth: March 21, 1951  Transition of Care [facility]) CM/SW Contact:    Kermit Balo, RN Phone Number: 01/30/2020, 2:38 PM  Clinical Narrative:                 Pt is from [place] here in [place] visiting family. She plans to return to [place] in about 3 weeks.  Recommendations are for outpatient therapy. Pt and DIL agreeable to attend at [facility] in the time she is here. Pt has transportation for the therapy.  Pt denies any medications issues.  TOC following for further d/c needs.  Expected Discharge Plan: OP Rehab Barriers to Discharge: Continued Medical Work up   Patient Goals and CMS Choice     Choice offered to / list presented to : Patient, Adult Children  Expected Discharge Plan and Services Expected Discharge Plan: OP Rehab   Discharge Planning Services: CM Consult   Living arrangements for the past 2 months: Single Family Home                                      Prior Living Arrangements/Services Living arrangements for the past 2 months: Single Family Home Lives with:: Spouse, Adult Children Patient language and need for interpreter reviewed:: Yes Do you feel safe going back to the place where you live?: Yes      Need for Family Participation in Patient Care: Yes (Comment) Care giver support system in place?: Yes (comment)   Criminal Activity/Legal Involvement Pertinent to Current Situation/Hospitalization: No - Comment as needed  Activities of Daily Living      Permission Sought/Granted                  Emotional Assessment Appearance:: Appears stated age Attitude/Demeanor/Rapport: Engaged Affect (typically observed): Accepting Orientation: : Oriented to Self, Oriented to Place, Oriented to  Time, Oriented to Situation   Psych Involvement: No (comment)  Admission diagnosis:  Hypocalcemia [E83.51] Hypokalemia  [E87.6] Hypomagnesemia [E83.42] Seizure (HCC) [R56.9] Hematoma of scalp, initial encounter [S00.03XA] Injury of head, initial encounter [S09.90XA] Patient Active Problem List   Diagnosis Date Noted  . BPPV (benign paroxysmal positional vertigo), left 01/30/2020  . Hypokalemia   . Hypomagnesemia   . Dizziness   . Nonintractable headache   . Seizure (HCC) 01/28/2020  . Electrolyte abnormality 01/28/2020  . Multiple sclerosis (HCC) 01/28/2020  . Essential hypertension 01/28/2020  . Hematoma of scalp    PCP:  Patient, No Pcp Per Pharmacy:   [id number] - [place], [place] - [address] AT [facility] OF [address] [place] [address] Phone: [phone] Fax: [phone]  [id number] [place], [place] - [address] AT [facility] OF [address] & [facility] [address] [place] [address] Phone: [phone] Fax: [phone]     Social Determinants of Health (SDOH) Interventions    Readmission Risk Interventions No flowsheet data found.

## 2020-01-31 LAB — 10-HYDROXYCARBAZEPINE: Triliptal/MTB(Oxcarbazepin): 11 ug/mL (ref 10–35)

## 2020-02-02 ENCOUNTER — Emergency Department (HOSPITAL_COMMUNITY)
Admission: EM | Admit: 2020-02-02 | Discharge: 2020-02-02 | Disposition: A | Discharge status: Home / Self Care | Payer: Medicare (Managed Care) | Source: Home / Self Care | Attending: Emergency Medicine | Admitting: Emergency Medicine

## 2020-02-02 ENCOUNTER — Encounter (HOSPITAL_COMMUNITY): Payer: Self-pay | Admitting: Emergency Medicine

## 2020-02-02 ENCOUNTER — Other Ambulatory Visit: Payer: Self-pay

## 2020-02-02 DIAGNOSIS — R21 Rash and other nonspecific skin eruption: Secondary | ICD-10-CM | POA: Insufficient documentation

## 2020-02-02 DIAGNOSIS — Z79899 Other long term (current) drug therapy: Secondary | ICD-10-CM | POA: Insufficient documentation

## 2020-02-02 DIAGNOSIS — R4182 Altered mental status, unspecified: Secondary | ICD-10-CM | POA: Diagnosis not present

## 2020-02-02 DIAGNOSIS — S0083XD Contusion of other part of head, subsequent encounter: Secondary | ICD-10-CM

## 2020-02-02 DIAGNOSIS — E871 Hypo-osmolality and hyponatremia: Secondary | ICD-10-CM | POA: Diagnosis not present

## 2020-02-02 DIAGNOSIS — Z87891 Personal history of nicotine dependence: Secondary | ICD-10-CM | POA: Insufficient documentation

## 2020-02-02 DIAGNOSIS — Z5189 Encounter for other specified aftercare: Secondary | ICD-10-CM

## 2020-02-02 DIAGNOSIS — I10 Essential (primary) hypertension: Secondary | ICD-10-CM | POA: Insufficient documentation

## 2020-02-02 DIAGNOSIS — W19XXXD Unspecified fall, subsequent encounter: Secondary | ICD-10-CM | POA: Insufficient documentation

## 2020-02-02 MED ORDER — CEPHALEXIN 500 MG PO CAPS: 500.0000 mg | ORAL_CAPSULE | Freq: Two times a day (BID) | ORAL | 0 refills | Status: AC

## 2020-02-02 MED ORDER — LIDOCAINE-EPINEPHRINE (PF) 2 %-1:200000 IJ SOLN
10.0000 mL | Freq: Once | INTRAMUSCULAR | Status: AC
  Administered 2020-02-02: 10 mL via INTRADERMAL
  Filled 2020-02-02: qty 20

## 2020-02-02 NOTE — Discharge Instructions (Signed)
You have a hematoma that is healing on the back of your scalp.  The size has gone down substantially since last time I saw you.  However there appears to be an area of necrosis, or dead tissue, right over the top of the wound.  This may have a difficult time healing.  I suggested that you follow-up with Dr. Ulice Bold, a plastic surgeon and wound specialist, in the clinic this week.  Please call their office tomorrow to schedule a follow-up appointment.  You can reapply the bandages as we did in the ER at night if you continue having oozing on your pillow.  I prescribed Keflex for you to keep at home.  If you notice the redness spreading around the edges of your wound, or you begin having pus draining from your wound, please fill this prescription and start taking it.  You can bathe and shower normally.

## 2020-02-02 NOTE — ED Provider Notes (Signed)
[facility] EMERGENCY DEPARTMENT Provider Note   CSN: [id number] Arrival date & time: 02/02/20  1528     History Chief Complaint  Patient presents with  . Wound Check    Carole Doner Sobczak is a 69 y.o. female history of seizures presenting to ED with bleeding wound.  Patient was seen by myself in the emergency department 5 days ago after a seizure with a fall backwards, striking the back of her head on the ground.  She was noted to have a large scalp hematoma on the back of her head, with a macerated wound that I was unable to repair.  She was admitted to the hospital for electrolyte repletion for her seizures.  She was discharged 2 days ago.  She returns to the ED today complaining of continued oozing from the back of her head.  She said there is a steady amount of bleeding particularly at night onto her pillow.  She also reports she has had persistent pain over her hematoma site.  HPI     Past Medical History:  Diagnosis Date  . Anxiety   . Hypertension   . Multiple sclerosis ([place])   . Seizures [facility])     Patient Active Problem List   Diagnosis Date Noted  . BPPV (benign paroxysmal positional vertigo), left 01/30/2020  . Hypokalemia   . Hypomagnesemia   . Dizziness   . Nonintractable headache   . Seizure ([place]) 01/28/2020  . Electrolyte abnormality 01/28/2020  . Multiple sclerosis ([place]) 01/28/2020  . Essential hypertension 01/28/2020  . Hematoma of scalp     Past Surgical History:  Procedure Laterality Date  . ABDOMINAL HYSTERECTOMY    . EYE SURGERY    . KNEE SURGERY Left      OB History   No obstetric history on file.     Family History  Problem Relation Age of Onset  . Multiple sclerosis Brother   . Seizures Nephew     Social History   Tobacco Use  . Smoking status: Former Smoker    Packs/day: 1.00    Years: 25.00    Pack years: 25.00    Types: Cigarettes    Quit date: 01/17/2014    Years since quitting: 6.0  . Smokeless tobacco:  Never Used  Substance Use Topics  . Alcohol use: Yes    Comment: Socially   . Drug use: No    Home Medications Prior to Admission medications   Medication Sig Start Date End Date Taking? Authorizing Provider  albuterol (PROVENTIL HFA;VENTOLIN HFA) 108 (90 BASE) MCG/ACT inhaler Inhale 2 puffs into the lungs every 6 (six) hours as needed for wheezing or shortness of breath.    [provider]  ALPRAZolam Duanne Moron) 0.5 MG tablet Take 0.5 mg by mouth 3 (three) times daily as needed for anxiety.    [provider]  amLODipine (NORVASC) 10 MG tablet Take 10 mg by mouth daily. 01/23/20   [provider]  Biotin 1000 MCG tablet Take 1,000 mcg by mouth in the morning and at bedtime.    [provider]  busPIRone (BUSPAR) 7.5 MG tablet Take 7.5 mg by mouth 2 (two) times daily. 01/24/20   [provider]  cephALEXin (KEFLEX) 500 MG capsule Take 1 capsule (500 mg total) by mouth 2 (two) times daily for 7 days. 02/02/20 02/09/20  Wyvonnia Dusky, MD  donepezil (ARICEPT) 10 MG tablet Take 10 mg by mouth at bedtime.    [provider]  gabapentin (  NEURONTIN) 600 MG tablet Take 600 mg by mouth in the morning, at noon, and at bedtime. 05/02/06   [provider]  loperamide (IMODIUM A-D) 2 MG tablet Take 1 tablet (2 mg total) by mouth 4 (four) times daily as needed for diarrhea or loose stools. 01/30/20   Rodolph Bong, MD  meclizine (ANTIVERT) 25 MG tablet Take 1 tablet (25 mg total) by mouth 3 (three) times daily as needed for dizziness. 01/30/20   Rodolph Bong, MD  modafinil (PROVIGIL) 200 MG tablet Take 200 mg by mouth every morning. 12/26/19   [provider]  omeprazole (PRILOSEC) 20 MG capsule Take 20 mg by mouth in the morning and at bedtime.     [provider]  OXcarbazepine (TRILEPTAL) 150 MG tablet Take 1 tablet (150 mg total) by mouth at bedtime. 01/30/20   Rodolph Bong, MD  Oxcarbazepine (TRILEPTAL) 300 MG tablet  Take 1 tablet (300 mg total) by mouth daily. 01/31/20   Rodolph Bong, MD  QUEtiapine (SEROQUEL XR) 400 MG 24 hr tablet Take 400 mg by mouth daily. 01/23/20   [provider]  RESTASIS 0.05 % ophthalmic emulsion Place 1 drop into both eyes 2 (two) times daily. 11/11/19   [provider]  sertraline (ZOLOFT) 100 MG tablet Take 100 mg by mouth daily.    [provider]  sodium chloride 1 g tablet Take 1 g by mouth 2 (two) times daily. 01/23/20   [provider]  tolterodine (DETROL LA) 4 MG 24 hr capsule Take 4 mg by mouth daily.    [provider]  traZODone (DESYREL) 100 MG tablet Take 100 mg by mouth at bedtime.    [provider]  venlafaxine XR (EFFEXOR-XR) 75 MG 24 hr capsule Take 75 mg by mouth daily. 01/23/20   [provider]    Allergies    Patient has no known allergies.  Review of Systems   Review of Systems  Constitutional: Negative for chills and fever.  Respiratory: Negative for cough and shortness of breath.   Cardiovascular: Negative for chest pain and palpitations.  Gastrointestinal: Negative for abdominal pain and vomiting.  Skin: Positive for rash and wound.  Neurological: Positive for headaches. Negative for syncope.  Psychiatric/Behavioral: Negative for agitation and confusion.  All other systems reviewed and are negative.   Physical Exam Updated Vital Signs BP 140/80 (BP Location: Right Wrist)   Pulse 80   Temp 98 F (36.7 C)   Resp 17   SpO2 98%   Physical Exam Vitals and nursing note reviewed.  Constitutional:      General: She is not in acute distress.    Appearance: She is well-developed.  HENT:     Head: Normocephalic.     Comments: Occipital hematoma with significant decreased size since prior ED assessment, no active bleeding on exam, quarter-sized region of necrotic tissue overlying former site of maceration Eyes:     Conjunctiva/sclera: Conjunctivae normal.  Cardiovascular:     Rate  and Rhythm: Normal rate and regular rhythm.  Pulmonary:     Effort: Pulmonary effort is normal. No respiratory distress.  Musculoskeletal:     Cervical back: Neck supple.  Skin:    General: Skin is warm and dry.  Neurological:     General: No focal deficit present.     Mental Status: She is alert and oriented to person, place, and time.  Psychiatric:        Mood and Affect: Mood normal.  Behavior: Behavior normal.     ED Results / Procedures / Treatments   Labs (all labs ordered are listed, but only abnormal results are displayed) Labs Reviewed - No data to display  EKG None  Radiology No results found.  Procedures Procedures (including critical care time)  Medications Ordered in ED Medications  lidocaine-EPINEPHrine (XYLOCAINE W/EPI) 2 %-1:200000 (PF) injection 10 mL (10 mLs Intradermal Given by Other 02/02/20 2008)    ED Course  I have reviewed the triage vital signs and the nursing notes.  Pertinent labs & imaging results that were available during my care of the patient were reviewed by me and considered in my medical decision making (see chart for details).  69 yo female here for wound care after sustaining occipital scalp hematoma from a fall (w/ seizure) several days ago.  Her hematoma size has dramatically improved from when I saw her last, and I suspect most of the blood drained with her initial compression bandages.  She tells me she continues to ooze at night on her pillow.  There is no active bleeding in the ER to suggest capillary injury or any wound amenable to cautery or suturing.  The wound opening appears to have become necrotic, likely due to the hematoma.  It does not appear infected.  There was small oozing with compression.  I injected a very small amount of lidocaine with epi locally (I did not want to introduce more vasocontrictor or fluid to impede her wound healing).  Reapplied bandages and advised wearing these for sleep.  She will f/u in with  Dr Ulice Bold one of our plastic surgery wound care specialists in the clinic this week.   The plan was relayed to her daughter in law Liddy at bedside, as well as her daughter Dr Brown Human, and EDP in [place], by phone.    Clinical Course as of Feb 02 2323  Wynelle Link Feb 02, 2020  1959 No active bleed from the wound after reassessment.  We will wrap it again with Kerlix and a 4 x 4.  I gave them follow-up instructions with the plastic surgeon wound care clinic with Dr Kelly Splinter, and also watch and wait antibiotics script in case they notice signs of infection developing   [MT]    Clinical Course User Index [MT] Perley Arthurs, Kermit Balo, MD    Final Clinical Impression(s) / ED Diagnoses Final diagnoses:  Visit for wound check  Hematoma of occipital surface of head, subsequent encounter    Rx / DC Orders ED Discharge Orders         Ordered    cephALEXin (KEFLEX) 500 MG capsule  2 times daily     02/02/20 2003           Terald Sleeper, MD 02/02/20 2324

## 2020-02-02 NOTE — ED Triage Notes (Signed)
Pt here for evaluation of wound to the posterior of her head following a seizure and fall on 5/11. Pt and family reports that the wound continues to ooze and bleed, and does not appear to be healing. Spoke with pt's daughter Dr. Brown Human, she is requesting a call for further information when pt is in a room. If before 5:30, call [phone], after 5:30, call [phone]

## 2020-02-04 ENCOUNTER — Inpatient Hospital Stay (HOSPITAL_COMMUNITY)
Admission: EM | Admit: 2020-02-04 | Discharge: 2020-02-06 | DRG: 640 | Disposition: A | Discharge status: Home / Self Care | Payer: Medicare (Managed Care) | Attending: Internal Medicine | Admitting: Internal Medicine

## 2020-02-04 ENCOUNTER — Ambulatory Visit: Payer: Medicare (Managed Care) | Attending: Orthopedic Surgery | Admitting: Physical Therapy

## 2020-02-04 ENCOUNTER — Encounter (HOSPITAL_COMMUNITY): Payer: Self-pay | Admitting: Emergency Medicine

## 2020-02-04 ENCOUNTER — Other Ambulatory Visit: Payer: Self-pay | Admitting: Internal Medicine

## 2020-02-04 ENCOUNTER — Encounter: Payer: Self-pay | Admitting: Physical Therapy

## 2020-02-04 ENCOUNTER — Encounter: Payer: Self-pay | Admitting: Internal Medicine

## 2020-02-04 ENCOUNTER — Emergency Department (HOSPITAL_COMMUNITY): Payer: Medicare (Managed Care)

## 2020-02-04 ENCOUNTER — Ambulatory Visit (INDEPENDENT_AMBULATORY_CARE_PROVIDER_SITE_OTHER): Payer: Medicare (Managed Care) | Admitting: Internal Medicine

## 2020-02-04 ENCOUNTER — Other Ambulatory Visit: Payer: Self-pay

## 2020-02-04 VITALS — BP 186/102 | HR 84 | Temp 97.5°F | Ht 63.0 in | Wt 114.0 lb

## 2020-02-04 DIAGNOSIS — F329 Major depressive disorder, single episode, unspecified: Secondary | ICD-10-CM | POA: Diagnosis present

## 2020-02-04 DIAGNOSIS — G9340 Encephalopathy, unspecified: Secondary | ICD-10-CM | POA: Diagnosis present

## 2020-02-04 DIAGNOSIS — M6281 Muscle weakness (generalized): Secondary | ICD-10-CM | POA: Insufficient documentation

## 2020-02-04 DIAGNOSIS — R42 Dizziness and giddiness: Secondary | ICD-10-CM

## 2020-02-04 DIAGNOSIS — R569 Unspecified convulsions: Secondary | ICD-10-CM | POA: Diagnosis present

## 2020-02-04 DIAGNOSIS — H8113 Benign paroxysmal vertigo, bilateral: Secondary | ICD-10-CM | POA: Insufficient documentation

## 2020-02-04 DIAGNOSIS — E871 Hypo-osmolality and hyponatremia: Secondary | ICD-10-CM

## 2020-02-04 DIAGNOSIS — H8112 Benign paroxysmal vertigo, left ear: Secondary | ICD-10-CM

## 2020-02-04 DIAGNOSIS — S0100XA Unspecified open wound of scalp, initial encounter: Secondary | ICD-10-CM

## 2020-02-04 DIAGNOSIS — E876 Hypokalemia: Secondary | ICD-10-CM | POA: Diagnosis present

## 2020-02-04 DIAGNOSIS — D649 Anemia, unspecified: Secondary | ICD-10-CM | POA: Diagnosis present

## 2020-02-04 DIAGNOSIS — S0101XD Laceration without foreign body of scalp, subsequent encounter: Secondary | ICD-10-CM

## 2020-02-04 DIAGNOSIS — Z87891 Personal history of nicotine dependence: Secondary | ICD-10-CM

## 2020-02-04 DIAGNOSIS — R41840 Attention and concentration deficit: Secondary | ICD-10-CM | POA: Insufficient documentation

## 2020-02-04 DIAGNOSIS — R278 Other lack of coordination: Secondary | ICD-10-CM | POA: Insufficient documentation

## 2020-02-04 DIAGNOSIS — I10 Essential (primary) hypertension: Secondary | ICD-10-CM | POA: Diagnosis present

## 2020-02-04 DIAGNOSIS — G35 Multiple sclerosis: Secondary | ICD-10-CM | POA: Diagnosis present

## 2020-02-04 DIAGNOSIS — Z820 Family history of epilepsy and other diseases of the nervous system: Secondary | ICD-10-CM

## 2020-02-04 DIAGNOSIS — Z20822 Contact with and (suspected) exposure to covid-19: Secondary | ICD-10-CM | POA: Diagnosis present

## 2020-02-04 DIAGNOSIS — R2681 Unsteadiness on feet: Secondary | ICD-10-CM

## 2020-02-04 DIAGNOSIS — G9341 Metabolic encephalopathy: Secondary | ICD-10-CM

## 2020-02-04 DIAGNOSIS — E860 Dehydration: Secondary | ICD-10-CM

## 2020-02-04 DIAGNOSIS — F419 Anxiety disorder, unspecified: Secondary | ICD-10-CM | POA: Diagnosis present

## 2020-02-04 LAB — CBC
HCT: 35.5 % — ABNORMAL LOW (ref 36.0–46.0)
Hemoglobin: 12.1 g/dL (ref 12.0–15.0)
MCH: 31 pg (ref 26.0–34.0)
MCHC: 34.1 g/dL (ref 30.0–36.0)
MCV: 91 fL (ref 80.0–100.0)
Platelets: 359 10*3/uL (ref 150–400)
RBC: 3.9 MIL/uL (ref 3.87–5.11)
RDW: 12.6 % (ref 11.5–15.5)
WBC: 10.1 10*3/uL (ref 4.0–10.5)
nRBC: 0 % (ref 0.0–0.2)

## 2020-02-04 LAB — COMPREHENSIVE METABOLIC PANEL
ALT: 19 U/L (ref 0–44)
AST: 24 U/L (ref 15–41)
Albumin: 4 g/dL (ref 3.5–5.0)
Alkaline Phosphatase: 102 U/L (ref 38–126)
Anion gap: 14 (ref 5–15)
BUN: 9 mg/dL (ref 8–23)
CO2: 21 mmol/L — ABNORMAL LOW (ref 22–32)
Calcium: 9.3 mg/dL (ref 8.9–10.3)
Chloride: 89 mmol/L — ABNORMAL LOW (ref 98–111)
Creatinine, Ser: 0.66 mg/dL (ref 0.44–1.00)
GFR calc Af Amer: >60 mL/min (ref >60)
GFR calc non Af Amer: >60 mL/min (ref >60)
Glucose, Bld: 125 mg/dL — ABNORMAL HIGH (ref 70–99)
Potassium: 3 mmol/L — ABNORMAL LOW (ref 3.5–5.1)
Sodium: 124 mmol/L — ABNORMAL LOW (ref 135–145)
Total Bilirubin: 0.3 mg/dL (ref 0.3–1.2)
Total Protein: 6.6 g/dL (ref 6.5–8.1)

## 2020-02-04 LAB — DIFFERENTIAL
Abs Immature Granulocytes: 0.08 10*3/uL — ABNORMAL HIGH (ref 0.00–0.07)
Basophils Absolute: 0.1 10*3/uL (ref 0.0–0.1)
Basophils Relative: 1 %
Eosinophils Absolute: 0.1 10*3/uL (ref 0.0–0.5)
Eosinophils Relative: 1 %
Immature Granulocytes: 1 %
Lymphocytes Relative: 21 %
Lymphs Abs: 2.1 10*3/uL (ref 0.7–4.0)
Monocytes Absolute: 0.9 10*3/uL (ref 0.1–1.0)
Monocytes Relative: 9 %
Neutro Abs: 6.8 10*3/uL (ref 1.7–7.7)
Neutrophils Relative %: 67 %

## 2020-02-04 LAB — PROTIME-INR
INR: 0.9 (ref 0.8–1.2)
Prothrombin Time: 12.1 seconds (ref 11.4–15.2)

## 2020-02-04 LAB — APTT: aPTT: 34 seconds (ref 24–36)

## 2020-02-04 MED ORDER — MUPIROCIN 2 % EX OINT: 1.0000 "application " | TOPICAL_OINTMENT | Freq: Three times a day (TID) | CUTANEOUS | 0 refills | Status: AC

## 2020-02-04 MED ORDER — SODIUM CHLORIDE 0.9% FLUSH
3.0000 mL | Freq: Once | INTRAVENOUS | Status: DC
Start: 2020-02-04 — End: 2020-02-06

## 2020-02-04 NOTE — Progress Notes (Signed)
   Subjective:     Patient ID: Tara Wade, female    DOB: 14-Jan-1951, 69 y.o.   MRN: [id number]  Chief Complaint  Patient presents with  . Advice Only    for hematoma on back of head-fell and hit head on (01/28/20)    HPI: The patient is a 69 y.o. female here for wound on the back of the head.  Patient states that on 5/11 she had a seizure and had fallen backwards and hit the back of her head.  She states that since the incident she developed a wound on the back of the head.  Her daughter in law helps clean the wound daily and keeps it wrapped with kerlix.  She states she started her antibiotic prescription yesterday.  She denies fever/chills, nausea/vomiting, purulent drainage or increased redness/warmth to the wound area.  Review of Systems  All other systems reviewed and are negative.    has a past medical history of Anxiety, Hypertension, Multiple sclerosis (HCC), and Seizures (H[facility]).  has a past surgical history that includes Abdominal hysterectomy; Knee surgery (Left); and Eye surgery.  reports that she quit smoking about 6 years ago. Her smoking use included cigarettes. She has a 25.00 pack-year smoking history. She has never used smokeless tobacco. Objective:   Vital Signs BP (!) 186/102 (BP Location: Left Arm, Patient Position: Sitting, Cuff Size: Normal)   Pulse 84   Temp (!) 97.5 F (36.4 C) (Temporal)   Ht 5\' 3"  (1.6 m)   Wt 114 lb (51.7 kg)   SpO2 98%   BMI 20.19 kg/m  Vital Signs and Nursing Note Reviewed Physical Exam  Constitutional: She is well-developed, well-nourished, and in no distress.  Skin:  Scalp wound measures: 3.0 x 2.5 x 0.1 cm Dried blood at wound site Small opening in the center of the wound draining serosanguinous fluid      Assessment/Plan:     ICD-10-CM   1. Open wound of scalp, unspecified open wound type, initial encounter  S01.00XA    Assessment: Scalp wound on occipital region  Patient was admitted 5/11 for a seizure.  During  the episode she hit the back of her head and was noted to have a large scalp hematoma unable to be repaired.  She visited the ED after discharge on 5/16 for wound check due to drainage.  It was not infected at that time.  She was started on Keflex and referred to our office.  Today the wound does not appear infected.  It has a small opening with minimal serosanguineous drainage.  I recommended that the patient continue the Keflex course until completed.  I also prescribed Bactroban ointment to be used up to 3 times a day on the wound area.  She can shower.  Patient is out of town and will likely be heading back to her home in [place] in a couple of weeks.  I would like close follow up until then.  I will see her back in a week.   Plan -complete keflex -bactroban ointment 3x daily, keep covered with nonstick pad and gauze wrap  -Follow-up in 1 week -In office wound was cleaned, antibiotic ointment placed with nonstick pad and soft headband wrap   [place], DO 02/04/2020, 1:05 PM

## 2020-02-04 NOTE — ED Triage Notes (Signed)
Pt here after neuro Pt with reports of trouble finding her words and disoriented. Daughter noticed symptoms around 3:45. Pt has neuro deficits at this time. A&Ox4.

## 2020-02-04 NOTE — Patient Instructions (Signed)
Gaze Stabilization: Tip Card  1.Target must remain in focus, not blurry, and appear stationary while head is in motion. 2.Perform exercises with small head movements (45 to either side of midline). 3.Increase speed of head motion so long as target is in focus. 4.If you wear eyeglasses, be sure you can see target through lens (therapist will give specific instructions for bifocal / progressive lenses). 5.These exercises may provoke dizziness or nausea. Work through these symptoms. If too dizzy, slow head movement slightly. Rest between each exercise. 6.Exercises demand concentration; avoid distractions. 7.For safety, perform standing exercises close to a counter, wall, corner, or next to someone.    Gaze Stabilization: Sitting    Keeping eyes on target on wall \\_10N  feet away, tilt head down 15-30 and move head side to side for _30___ seconds. Repeat while moving head up and down for _30___ seconds.  SIT 5'- 6' away from letter on plain background.  Do _3___ sessions per day. Repeat using target on pattern background.  Copyright  VHI. All rights reserved.

## 2020-02-04 NOTE — Patient Instructions (Addendum)
Tara Wade It was a pleasure seeing you today.  Please follow the instructions below for your wound care  1) place mupirocin ointment on your scalp 3 times daily 2) place a non stick pad  3) wrap with gauze 4) continue to take your antibiotics   Please follow up with me in 1 weeks.  Call us at [phone] with any questions or concerns

## 2020-02-05 ENCOUNTER — Encounter: Payer: Self-pay | Admitting: Physical Therapy

## 2020-02-05 ENCOUNTER — Encounter (HOSPITAL_COMMUNITY): Payer: Self-pay | Admitting: Internal Medicine

## 2020-02-05 DIAGNOSIS — E876 Hypokalemia: Secondary | ICD-10-CM | POA: Diagnosis present

## 2020-02-05 DIAGNOSIS — E871 Hypo-osmolality and hyponatremia: Secondary | ICD-10-CM | POA: Diagnosis present

## 2020-02-05 DIAGNOSIS — Z87891 Personal history of nicotine dependence: Secondary | ICD-10-CM | POA: Diagnosis not present

## 2020-02-05 DIAGNOSIS — G35 Multiple sclerosis: Secondary | ICD-10-CM | POA: Diagnosis present

## 2020-02-05 DIAGNOSIS — R569 Unspecified convulsions: Secondary | ICD-10-CM | POA: Diagnosis present

## 2020-02-05 DIAGNOSIS — Z20822 Contact with and (suspected) exposure to covid-19: Secondary | ICD-10-CM | POA: Diagnosis present

## 2020-02-05 DIAGNOSIS — G9340 Encephalopathy, unspecified: Secondary | ICD-10-CM | POA: Diagnosis not present

## 2020-02-05 DIAGNOSIS — S0101XD Laceration without foreign body of scalp, subsequent encounter: Secondary | ICD-10-CM | POA: Diagnosis not present

## 2020-02-05 DIAGNOSIS — D649 Anemia, unspecified: Secondary | ICD-10-CM | POA: Diagnosis present

## 2020-02-05 DIAGNOSIS — I10 Essential (primary) hypertension: Secondary | ICD-10-CM | POA: Diagnosis present

## 2020-02-05 DIAGNOSIS — Z820 Family history of epilepsy and other diseases of the nervous system: Secondary | ICD-10-CM | POA: Diagnosis not present

## 2020-02-05 DIAGNOSIS — R4182 Altered mental status, unspecified: Secondary | ICD-10-CM | POA: Diagnosis present

## 2020-02-05 DIAGNOSIS — F419 Anxiety disorder, unspecified: Secondary | ICD-10-CM | POA: Diagnosis present

## 2020-02-05 DIAGNOSIS — F329 Major depressive disorder, single episode, unspecified: Secondary | ICD-10-CM | POA: Diagnosis present

## 2020-02-05 DIAGNOSIS — G9341 Metabolic encephalopathy: Secondary | ICD-10-CM | POA: Diagnosis present

## 2020-02-05 LAB — MAGNESIUM
Magnesium: 1.1 mg/dL — ABNORMAL LOW (ref 1.7–2.4)
Magnesium: 3 mg/dL — ABNORMAL HIGH (ref 1.7–2.4)

## 2020-02-05 LAB — BASIC METABOLIC PANEL
Anion gap: 9 (ref 5–15)
Anion gap: 10 (ref 5–15)
Anion gap: 10 (ref 5–15)
BUN: 7 mg/dL — ABNORMAL LOW (ref 8–23)
BUN: 6 mg/dL — ABNORMAL LOW (ref 8–23)
BUN: 7 mg/dL — ABNORMAL LOW (ref 8–23)
CO2: 25 mmol/L (ref 22–32)
CO2: 23 mmol/L (ref 22–32)
CO2: 22 mmol/L (ref 22–32)
Calcium: 9.1 mg/dL (ref 8.9–10.3)
Calcium: 9.4 mg/dL (ref 8.9–10.3)
Calcium: 9.2 mg/dL (ref 8.9–10.3)
Chloride: 94 mmol/L — ABNORMAL LOW (ref 98–111)
Chloride: 99 mmol/L (ref 98–111)
Chloride: 100 mmol/L (ref 98–111)
Creatinine, Ser: 0.56 mg/dL (ref 0.44–1.00)
Creatinine, Ser: 0.53 mg/dL (ref 0.44–1.00)
Creatinine, Ser: 0.63 mg/dL (ref 0.44–1.00)
GFR calc Af Amer: >60 mL/min (ref >60)
GFR calc Af Amer: >60 mL/min (ref >60)
GFR calc Af Amer: >60 mL/min (ref >60)
GFR calc non Af Amer: >60 mL/min (ref >60)
GFR calc non Af Amer: >60 mL/min (ref >60)
GFR calc non Af Amer: >60 mL/min (ref >60)
Glucose, Bld: 106 mg/dL — ABNORMAL HIGH (ref 70–99)
Glucose, Bld: 102 mg/dL — ABNORMAL HIGH (ref 70–99)
Glucose, Bld: 89 mg/dL (ref 70–99)
Potassium: 3.4 mmol/L — ABNORMAL LOW (ref 3.5–5.1)
Potassium: 4.5 mmol/L (ref 3.5–5.1)
Potassium: 4.3 mmol/L (ref 3.5–5.1)
Sodium: 128 mmol/L — ABNORMAL LOW (ref 135–145)
Sodium: 132 mmol/L — ABNORMAL LOW (ref 135–145)
Sodium: 132 mmol/L — ABNORMAL LOW (ref 135–145)

## 2020-02-05 LAB — OSMOLALITY: Osmolality: 263 mOsm/kg — ABNORMAL LOW (ref 275–295)

## 2020-02-05 LAB — CBC
HCT: 35.7 % — ABNORMAL LOW (ref 36.0–46.0)
Hemoglobin: 11.9 g/dL — ABNORMAL LOW (ref 12.0–15.0)
MCH: 30.7 pg (ref 26.0–34.0)
MCHC: 33.3 g/dL (ref 30.0–36.0)
MCV: 92 fL (ref 80.0–100.0)
Platelets: 361 10*3/uL (ref 150–400)
RBC: 3.88 MIL/uL (ref 3.87–5.11)
RDW: 12.5 % (ref 11.5–15.5)
WBC: 8 10*3/uL (ref 4.0–10.5)
nRBC: 0 % (ref 0.0–0.2)

## 2020-02-05 LAB — SARS CORONAVIRUS 2 (TAT 6-24 HRS): SARS Coronavirus 2: NEGATIVE

## 2020-02-05 LAB — CORTISOL: Cortisol, Plasma: 11.5 ug/dL

## 2020-02-05 LAB — OSMOLALITY, URINE: Osmolality, Ur: 193 mOsm/kg — ABNORMAL LOW (ref 300–900)

## 2020-02-05 LAB — CBG MONITORING, ED: Glucose-Capillary: 124 mg/dL — ABNORMAL HIGH (ref 70–99)

## 2020-02-05 LAB — SODIUM, URINE, RANDOM: Sodium, Ur: 22 mmol/L

## 2020-02-05 LAB — TSH: TSH: 3.39 u[IU]/mL (ref 0.350–4.500)

## 2020-02-05 MED ORDER — SODIUM CHLORIDE 1 G PO TABS
1.0000 g | ORAL_TABLET | Freq: Two times a day (BID) | ORAL | Status: DC
  Administered 2020-02-05 – 2020-02-06 (×3): 1 g via ORAL
  Filled 2020-02-05 (×3): qty 1

## 2020-02-05 MED ORDER — FESOTERODINE FUMARATE ER 4 MG PO TB24
4.0000 mg | ORAL_TABLET | Freq: Every day | ORAL | Status: DC
  Administered 2020-02-05 – 2020-02-06 (×2): 4 mg via ORAL
  Filled 2020-02-05 (×3): qty 1

## 2020-02-05 MED ORDER — MODAFINIL 100 MG PO TABS
200.0000 mg | ORAL_TABLET | Freq: Every day | ORAL | Status: DC
  Administered 2020-02-06: 200 mg via ORAL
  Filled 2020-02-05: qty 1
  Filled 2020-02-05: qty 2

## 2020-02-05 MED ORDER — BUSPIRONE HCL 15 MG PO TABS
7.5000 mg | ORAL_TABLET | Freq: Two times a day (BID) | ORAL | Status: DC
  Administered 2020-02-05 – 2020-02-06 (×2): 7.5 mg via ORAL
  Filled 2020-02-05 (×3): qty 2
  Filled 2020-02-05 (×3): qty 1

## 2020-02-05 MED ORDER — SODIUM CHLORIDE 0.9 % IV SOLN: INTRAVENOUS | Status: DC

## 2020-02-05 MED ORDER — SODIUM CHLORIDE 0.9 % IV BOLUS (SEPSIS)
500.0000 mL | Freq: Once | INTRAVENOUS | Status: AC
  Administered 2020-02-05: 500 mL via INTRAVENOUS

## 2020-02-05 MED ORDER — ALBUTEROL SULFATE (2.5 MG/3ML) 0.083% IN NEBU: 2.5000 mg | INHALATION_SOLUTION | Freq: Four times a day (QID) | RESPIRATORY_TRACT | Status: DC | PRN

## 2020-02-05 MED ORDER — PANTOPRAZOLE SODIUM 40 MG PO TBEC
40.0000 mg | DELAYED_RELEASE_TABLET | Freq: Every day | ORAL | Status: DC
  Administered 2020-02-05 – 2020-02-06 (×2): 40 mg via ORAL
  Filled 2020-02-05 (×2): qty 1

## 2020-02-05 MED ORDER — ACETAMINOPHEN 325 MG PO TABS
650.0000 mg | ORAL_TABLET | Freq: Four times a day (QID) | ORAL | Status: DC | PRN
  Administered 2020-02-06: 650 mg via ORAL
  Filled 2020-02-05: qty 2

## 2020-02-05 MED ORDER — GABAPENTIN 600 MG PO TABS
600.0000 mg | ORAL_TABLET | Freq: Three times a day (TID) | ORAL | Status: DC
  Administered 2020-02-05 – 2020-02-06 (×4): 600 mg via ORAL
  Filled 2020-02-05 (×6): qty 1

## 2020-02-05 MED ORDER — TERIFLUNOMIDE 14 MG PO TABS
14.0000 mg | ORAL_TABLET | Freq: Every day | ORAL | Status: DC
  Administered 2020-02-05 – 2020-02-06 (×2): 14 mg via ORAL
  Filled 2020-02-05 (×3): qty 1

## 2020-02-05 MED ORDER — VENLAFAXINE HCL ER 75 MG PO CP24
75.0000 mg | ORAL_CAPSULE | Freq: Every day | ORAL | Status: DC
  Administered 2020-02-05 – 2020-02-06 (×2): 75 mg via ORAL
  Filled 2020-02-05 (×2): qty 1

## 2020-02-05 MED ORDER — QUETIAPINE FUMARATE ER 400 MG PO TB24
400.0000 mg | ORAL_TABLET | Freq: Every day | ORAL | Status: DC
  Administered 2020-02-05: 400 mg via ORAL
  Filled 2020-02-05 (×2): qty 1

## 2020-02-05 MED ORDER — AMLODIPINE BESYLATE 10 MG PO TABS
10.0000 mg | ORAL_TABLET | Freq: Every day | ORAL | Status: DC
  Administered 2020-02-05 – 2020-02-06 (×2): 10 mg via ORAL
  Filled 2020-02-05: qty 1
  Filled 2020-02-05: qty 2

## 2020-02-05 MED ORDER — ACETAMINOPHEN-CODEINE #3 300-30 MG PO TABS
1.0000 | ORAL_TABLET | Freq: Once | ORAL | Status: AC
  Administered 2020-02-05: 1 via ORAL
  Filled 2020-02-05: qty 1

## 2020-02-05 MED ORDER — OXCARBAZEPINE 300 MG PO TABS
300.0000 mg | ORAL_TABLET | Freq: Every day | ORAL | Status: DC
  Administered 2020-02-05 – 2020-02-06 (×2): 300 mg via ORAL
  Filled 2020-02-05 (×2): qty 1

## 2020-02-05 MED ORDER — POTASSIUM CHLORIDE CRYS ER 20 MEQ PO TBCR
40.0000 meq | EXTENDED_RELEASE_TABLET | Freq: Once | ORAL | Status: AC
  Administered 2020-02-05: 40 meq via ORAL
  Filled 2020-02-05: qty 2

## 2020-02-05 MED ORDER — HEPARIN SODIUM (PORCINE) 5000 UNIT/ML IJ SOLN
5000.0000 [IU] | Freq: Three times a day (TID) | INTRAMUSCULAR | Status: DC
  Administered 2020-02-05 – 2020-02-06 (×2): 5000 [IU] via SUBCUTANEOUS
  Filled 2020-02-05 (×2): qty 1

## 2020-02-05 MED ORDER — ONDANSETRON HCL 4 MG/2ML IJ SOLN: 4.0000 mg | Freq: Four times a day (QID) | INTRAMUSCULAR | Status: DC | PRN

## 2020-02-05 MED ORDER — BIOTIN 1000 MCG PO TABS: 1000.0000 ug | ORAL_TABLET | Freq: Every day | ORAL | Status: DC

## 2020-02-05 MED ORDER — MAGNESIUM SULFATE 2 GM/50ML IV SOLN
2.0000 g | Freq: Once | INTRAVENOUS | Status: AC
  Administered 2020-02-05: 2 g via INTRAVENOUS
  Filled 2020-02-05: qty 50

## 2020-02-05 MED ORDER — TRAMADOL HCL 50 MG PO TABS
50.0000 mg | ORAL_TABLET | Freq: Four times a day (QID) | ORAL | Status: DC | PRN
  Administered 2020-02-05: 50 mg via ORAL
  Filled 2020-02-05 (×2): qty 1

## 2020-02-05 MED ORDER — ONDANSETRON HCL 4 MG PO TABS: 4.0000 mg | ORAL_TABLET | Freq: Four times a day (QID) | ORAL | Status: DC | PRN

## 2020-02-05 MED ORDER — OXCARBAZEPINE 150 MG PO TABS
150.0000 mg | ORAL_TABLET | Freq: Every day | ORAL | Status: DC
  Administered 2020-02-05: 150 mg via ORAL
  Filled 2020-02-05 (×2): qty 1

## 2020-02-05 MED ORDER — CYCLOSPORINE 0.05 % OP EMUL
1.0000 [drp] | Freq: Two times a day (BID) | OPHTHALMIC | Status: DC
  Administered 2020-02-05 – 2020-02-06 (×3): 1 [drp] via OPHTHALMIC
  Filled 2020-02-05 (×4): qty 1

## 2020-02-05 MED ORDER — BACITRACIN ZINC 500 UNIT/GM EX OINT
TOPICAL_OINTMENT | Freq: Two times a day (BID) | CUTANEOUS | Status: DC
  Filled 2020-02-05: qty 28.4

## 2020-02-05 NOTE — Therapy (Signed)
[name] [address] [place], [place], [address] Phone: [phone]   Fax:  [phone]  Physical Therapy Evaluation  Patient Details  Name: Tara Wade MRN: [id number] Date of Birth: 1951-04-21 Referring Provider (PT): Irine Seal, MD   Encounter Date: 02/04/2020  PT End of Session - 02/05/20 1758    Visit Number  1    Number of Visits  4    Date for PT Re-Evaluation  03/06/20    Authorization Type  Generic Medicare Advantage    Authorization Time Period  02-04-20 - 04-05-20    PT Start Time  1450    PT Stop Time  1538    PT Time Calculation (min)  48 min    Activity Tolerance  Patient tolerated treatment well    Behavior During Therapy  [facility] for tasks assessed/performed       Past Medical History:  Diagnosis Date  . Anxiety   . Hypertension   . Multiple sclerosis ([place])   . Seizures ([place])     Past Surgical History:  Procedure Laterality Date  . ABDOMINAL HYSTERECTOMY    . EYE SURGERY    . KNEE SURGERY Left     There were no vitals filed for this visit.   Subjective Assessment - 02/05/20 1750    Subjective  Pt sustained a fall walking out of store last Tuesday, 01-28-20, due to having a seizure; pt states she is now having dizziness, balance and concentration difficulty; pt had scalp hematoma  - saw plastic surgeon earlier, prior to PT eval    Pertinent History  seizure with fall on 01-28-20 - hit her head    Patient Stated Goals  improve balance and decrease the dizziness         [facility] PT Assessment - 02/05/20 0001      Assessment   Medical Diagnosis  Dizziness due to fall secondary to seizure     Referring Provider (PT)  Irine Seal, MD    Onset Date/Surgical Date  01/28/20    Prior Therapy  pt received Epley maneuver during hospital admission (5-11 -01-30-20)      Precautions   Precautions  None      Balance Screen   Has the patient fallen in the past 6 months  Yes    How many  times?  1    Has the patient had a decrease in activity level because of a fear of falling?   No    Is the patient reluctant to leave their home because of a fear of falling?   No      Prior Function   Level of Independence  Independent    Vocation  Retired    Leisure  pt lives in [place] - is in town visiting son and his family              Vestibular Assessment - 02/05/20 0001      Vestibular Assessment   General Observation  pt is a 69 yr old female who had a seizure as she was leaving a store on 01-28-12, fell and hit her head; sustained  hematoma; was diagnosed with Lt BPPV during hospitalization       Symptom Behavior   Type of Dizziness   Spinning    Frequency of Dizziness  with movement    Duration of Dizziness  seconds    Symptom Nature  Motion provoked;Positional    Aggravating Factors  Sit to stand;Supine to sit  Relieving Factors  Head stationary    Progression of Symptoms  No change since onset    History of similar episodes  none      Positional Testing   Dix-Hallpike  Dix-Hallpike Right;Dix-Hallpike Left      Dix-Hallpike Right   Dix-Hallpike Right Duration  none    Dix-Hallpike Right Symptoms  No nystagmus      Dix-Hallpike Left   Dix-Hallpike Left Duration  none    Dix-Hallpike Left Symptoms  No nystagmus   pt took Meclizine prior to PT eval     Sidelying Right   Sidelying Right Duration  none    Sidelying Right Symptoms  No nystagmus      Sidelying Left   Sidelying Left Duration  none    Sidelying Left Symptoms  No nystagmus          Objective measurements completed on examination: See above findings.      Epley maneuver for Lt BPPV performed 2 reps - pt reported improvement in dizziness on 2nd rep of Epley's        PT Education - 02/05/20 1756    Education Details  x1 viewing in seated position; asked pt to withhold Meclizine prior to next PT appt to avoid any possible masking of symptoms    Person(s) Educated  Patient     Methods  Explanation;Handout    Comprehension  Verbalized understanding;Returned demonstration       PT Short Term Goals - 02/05/20 1805      PT SHORT TERM GOAL #1   Title  same as LTG's        PT Long Term Goals - 02/05/20 1805      PT LONG TERM GOAL #1   Title  Pt will have (-) Lt Dix-Hallpike test to indicate resolution of Lt BPPV.    Time  2    Period  Weeks    Status  New    Target Date  02/21/20      PT LONG TERM GOAL #2   Title  Independent in HEP for balance and vestibular exercises.    Time  2    Period  Weeks    Status  New    Target Date  02/21/20             Plan - 02/05/20 1759    Clinical Impression Statement  Pt is a 69 yr old lady with symptoms consistent with Lt BPPV posterior canalithiasis; no nystagmus noted with any positional testing,however, pt states she took Meclizine prior to initial eval.  Epley maneuver was performed 2 reps with pt reporting improvement in symptoms on the 2nd rep of Epley.  Will continue to assess vertigo; pt requested to withhold Meclizine prior to next scheduled appt.    Personal Factors and Comorbidities  Past/Current Experience;Transportation;Comorbidity 2    Comorbidities  MS, seizures, hypokalemia, electrolyte abnormality    Examination-Activity Limitations  Locomotion Level;Transfers;Bend;Squat;Lift;Reach Overhead    Examination-Participation Restrictions  Meal Prep;Cleaning;Community Activity;Interpersonal Relationship;Shop;Laundry    Stability/Clinical Decision Making  Evolving/Moderate complexity    Clinical Decision Making  Moderate    Rehab Potential  Good    PT Frequency  2x / week    PT Duration  2 weeks   pt states she plans on returning to [place] on 02-18-20   PT Treatment/Interventions  Canalith Repostioning;ADLs/Self Care Home Management;Balance training;Therapeutic exercise;Therapeutic activities;Neuromuscular re-education;Vestibular;Gait training;Stair training;Patient/family education    PT Next Visit  Plan  recheck Lt BPPV; begin balance HEP  Consulted and Agree with Plan of Care  Patient       Patient will benefit from skilled therapeutic intervention in order to improve the following deficits and impairments:  Dizziness, Decreased balance, Difficulty walking  Visit Diagnosis: Dizziness and giddiness - Plan: PT plan of care cert/re-cert  BPPV (benign paroxysmal positional vertigo), left - Plan: PT plan of care cert/re-cert  Unsteadiness on feet - Plan: PT plan of care cert/re-cert     Problem List Patient Active Problem List   Diagnosis Date Noted  . Acute encephalopathy 02/05/2020  . Hyponatremia 02/05/2020  . BPPV (benign paroxysmal positional vertigo), left 01/30/2020  . Hypokalemia   . Hypomagnesemia   . Dizziness   . Nonintractable headache   . Seizure ([facility]) 01/28/2020  . Electrolyte abnormality 01/28/2020  . Multiple sclerosis ([facility]) 01/28/2020  . Essential hypertension 01/28/2020  . Hematoma of scalp     Lauraann Missey, Donavan Burnet, PT 02/05/2020, 6:09 PM  [facility] [address] [place], [place], [address] Phone: [phone]   Fax:  [phone]  Name: Tyann Niehaus MRN: [id number] Date of Birth: October 08, 1950

## 2020-02-05 NOTE — ED Notes (Signed)
Raynald Kemp daughter [phone] call with any updates and when pt goes upstairs

## 2020-02-05 NOTE — ED Notes (Signed)
Report given that Pt was non verbal on first arrival to ed. Pt continues to be non interactive . Family in room and Pt was not interacting with family

## 2020-02-05 NOTE — ED Notes (Signed)
Lunch Tray Ordered @ 1040. 

## 2020-02-05 NOTE — Progress Notes (Signed)
[place] TEAM 1 - Stepdown/ICU TEAM  Tara Wade  YCX:[id number] DOB: 13-Sep-1951 DOA: 02/04/2020 PCP: Patient, No Pcp Per    Brief Narrative:  69 year old with a history of MS, prior seizures, and HTN who was admitted May 11-13 for seizures provoked by severe electrolyte abnormalities who was brought to the ER with word finding difficulty and confusion.  Patient is known to "drink a lot of sweet tea" and has had difficulty with sodium balance in the past.  In the ED CT head was without acute findings and labs revealed a sodium of 124 with a potassium of 3.  Most recent discharge sodium was 135.  Significant Events: 5/11-13 admit for seizure due to hyponatremia 5/19 readmit w/ AMS and Na 124  Subjective: Pt is seen for a f/u visit.    Assessment & Plan:  Toxic metabolic acute encephalopathy due to hyponatremia  Acute on Chronic Hyponatremia  Cortisol is normal -TSH is normal - serum osmolality is low at 263 and urine osmolality is very low at 193 - random urine sodium is 22 (findings NOT c/w SIADH)   Recent Labs  Lab 01/30/20 0429 02/04/20 1906 02/05/20 0437  NA 135 124* 128*    Provoked seizure None reported since last admission -felt to be a consequence of severe hyponatremia -continue Trileptal  Right parietal scalp laceration/hematoma Due to seizure with fall 5/11 - being followed in the plastic surgery clinic  Hypokalemia  Hypomagnesemia  Multiple sclerosis  HTN Blood pressure poorly controlled  Normocytic anemia  Depression  DVT prophylaxis: Subcutaneous heparin Code Status: FULL CODE Family Communication: Spoke with daughter at bedside Disposition Plan:  Status is: Observation  The patient will require care spanning > 2 midnights and should be moved to inpatient because: Persistent severe electrolyte disturbances  Dispo: The patient is from: Home              Anticipated d/c is to: Home              Anticipated d/c date is: 2 days               Patient currently is not medically stable to d/c.   Consultants:  None  Antimicrobials:  None  Objective: Blood pressure (!) 161/95, pulse 68, temperature 97.9 F (36.6 C), temperature source Oral, resp. rate 12, SpO2 97 %.  Intake/Output Summary (Last 24 hours) at 02/05/2020 0746 Last data filed at 02/05/2020 0418 Gross per 24 hour  Intake 550 ml  Output --  Net 550 ml   There were no vitals filed for this visit.  Examination: Pt was seen for a f/u visit.    CBC: Recent Labs  Lab 01/30/20 0429 02/04/20 1906 02/05/20 0437  WBC 8.8 10.1 8.0  NEUTROABS  --  6.8  --   HGB 11.1* 12.1 11.9*  HCT 34.2* 35.5* 35.7*  MCV 94.2 91.0 92.0  PLT 328 359 497   Basic Metabolic Panel: Recent Labs  Lab 01/30/20 0429 02/04/20 0132 02/04/20 1906 02/05/20 0437  NA 135  --  124* 128*  K 4.0  --  3.0* 3.4*  CL 108  --  89* 94*  CO2 20*  --  21* 25  GLUCOSE 99  --  125* 106*  BUN 10  --  9 7*  CREATININE 0.63  --  0.66 0.56  CALCIUM 8.4*  --  9.3 9.1  MG 2.0 1.1*  --  3.0*   GFR: Estimated Creatinine Clearance: 55.7 mL/min (by C-G formula  based on SCr of 0.56 mg/dL).  Liver Function Tests: Recent Labs  Lab 02/04/20 1906  AST 24  ALT 19  ALKPHOS 102  BILITOT 0.3  PROT 6.6  ALBUMIN 4.0   Coagulation Profile: Recent Labs  Lab 02/04/20 1906  INR 0.9    CBG: Recent Labs  Lab 02/05/20 0108  GLUCAP 124*    Recent Results (from the past 240 hour(s))  SARS CORONAVIRUS 2 (TAT 6-24 HRS) Nasopharyngeal Nasopharyngeal Swab     Status: None   Collection Time: 01/28/20  9:04 PM   Specimen: Nasopharyngeal Swab  Result Value Ref Range Status   SARS Coronavirus 2 NEGATIVE NEGATIVE Final    Comment: (NOTE) SARS-CoV-2 target nucleic acids are NOT DETECTED. The SARS-CoV-2 RNA is generally detectable in upper and lower respiratory specimens during the acute phase of infection. Negative results do not preclude SARS-CoV-2 infection, do not rule out co-infections with  other pathogens, and should not be used as the sole basis for treatment or other patient management decisions. Negative results must be combined with clinical observations, patient history, and epidemiological information. The expected result is Negative. Fact Sheet for Patients: HairSlick.no Fact Sheet for Healthcare Providers: quierodirigir.com This test is not yet approved or cleared by the [place] FDA and  has been authorized for detection and/or diagnosis of SARS-CoV-2 by FDA under an Emergency Use Authorization (EUA). This EUA will remain  in effect (meaning this test can be used) for the duration of the COVID-19 declaration under Section 56 4(b)(1) of the Act, 21 U.S.C. section 360bbb-3(b)(1), unless the authorization is terminated or revoked sooner. Performed at [facility], [address] [place], [place] [address]   SARS CORONAVIRUS 2 (TAT 6-24 HRS) Nasopharyngeal Nasopharyngeal Swab     Status: None   Collection Time: 02/05/20  1:45 AM   Specimen: Nasopharyngeal Swab  Result Value Ref Range Status   SARS Coronavirus 2 NEGATIVE NEGATIVE Final    Comment: (NOTE) SARS-CoV-2 target nucleic acids are NOT DETECTED. The SARS-CoV-2 RNA is generally detectable in upper and lower respiratory specimens during the acute phase of infection. Negative results do not preclude SARS-CoV-2 infection, do not rule out co-infections with other pathogens, and should not be used as the sole basis for treatment or other patient management decisions. Negative results must be combined with clinical observations, patient history, and epidemiological information. The expected result is Negative. Fact Sheet for Patients: HairSlick.no Fact Sheet for Healthcare Providers: quierodirigir.com This test is not yet approved or cleared by the [place] FDA and  has been authorized  for detection and/or diagnosis of SARS-CoV-2 by FDA under an Emergency Use Authorization (EUA). This EUA will remain  in effect (meaning this test can be used) for the duration of the COVID-19 declaration under Section 56 4(b)(1) of the Act, 21 U.S.C. section 360bbb-3(b)(1), unless the authorization is terminated or revoked sooner. Performed at [facility], [address] [place], [place] [address]      Scheduled Meds: . amLODipine  10 mg Oral Daily  . Biotin  1,000 mcg Oral Daily  . cycloSPORINE  1 drop Both Eyes BID  . fesoterodine  4 mg Oral Daily  . gabapentin  600 mg Oral TID  . heparin  5,000 Units Subcutaneous Q8H  . modafinil  200 mg Oral Daily  . OXcarbazepine  150 mg Oral QHS  . Oxcarbazepine  300 mg Oral Daily  . pantoprazole  40 mg Oral Daily  . QUEtiapine  400 mg  Oral Daily  . sodium chloride flush  3 mL Intravenous Once  . sodium chloride  1 g Oral BID  . venlafaxine XR  75 mg Oral Daily   Continuous Infusions:   LOS: 0 days   Time spent: No Charge  Lonia Blood, MD [facility] Office  [phone] Pager - Text Page per Amion as per below:  On-Call/Text Page:      [email or web address]  If 7PM-7AM, please contact night-coverage [email or web address] 02/05/2020, 7:46 AM

## 2020-02-05 NOTE — ED Notes (Addendum)
Pt ambulatory on arrival to room. Alert & Oriented x4.   Pt refused to change into hospital gown. Pt refused IV attempt by RN. Pt states she would like to talk to DR to find out what is going on and would like to go home. Family at bedside.

## 2020-02-05 NOTE — ED Provider Notes (Signed)
I discussed the case with patient's daughter Baxter Hire who is an emergency physician in [place] [phone] She reports that her mother will have frequent bouts of hyponatremia. On previous admissions patient has become delirious from multiple causes.  Benzodiazepines do not work for her agitation or delirium.  Small doses of Haldol have worked before.  They have also stopped her Ditropan before to help with these episodes   Zadie Rhine, MD 02/05/20 [phone]

## 2020-02-05 NOTE — H&P (Signed)
History and Physical    Tara Wade [id number]:[id number] DOB: 30-Sep-1950 DOA: 02/04/2020  PCP: Patient, No Pcp Per  Patient coming from: Home.  Chief Complaint: Confusion.  History obtained from patient's daughter Dr. Brown Human who can be reached at [phone].  HPI: Tara Wade is a 69 y.o. female with recent admission on Jan 28, 2020 for seizure provoked by severe electrolyte abnormalities was eventually discharged home on Jan 30, 2020 about 5 days ago was at the physical therapy yesterday and later in the evening around 3:30 PM at home was found to have some word finding difficulty and confusion this persisted and patient was brought to the ER.  As per the patient's daughter patient drinks a lot of iced tea and has had problems with sodium previously.  Patient has been compliant with her medications.  Not sure if she has still been taking her sodium chloride tablet.  Per patient she has not had any further diarrhea after the last time.  Did not have any loss of consciousness has been having some frontal headache since last 2 days for which patient has been taking some NSAIDs along with Tylenol.  ED Course: In the ER patient CT head does not show any acute EKG shows sinus tachycardia and on exam patient appears nonfocal.  Labs show sodium of 124 potassium 3.  Sodium is dropped from 135 at discharge about 5 days ago.  Patient is afebrile appears nonfocal.  Patient's acute encephalopathy was attributed to hyponatremia.  No definite evidence of any seizures or incontinence of urine or bowel.  Covid test is pending.  CBC shows stable hemoglobin.  Review of Systems: As per HPI, rest all negative.   Past Medical History:  Diagnosis Date  . Anxiety   . Hypertension   . Multiple sclerosis (HCC)   . Seizures (HCC)     Past Surgical History:  Procedure Laterality Date  . ABDOMINAL HYSTERECTOMY    . EYE SURGERY    . KNEE SURGERY Left      reports that she quit smoking about 6 years ago.  Her smoking use included cigarettes. She has a 25.00 pack-year smoking history. She has never used smokeless tobacco. She reports current alcohol use. She reports that she does not use drugs.  No Known Allergies  Family History  Problem Relation Age of Onset  . Multiple sclerosis Brother   . Seizures Nephew     Prior to Admission medications   Medication Sig Start Date End Date Taking? Authorizing Provider  albuterol (PROVENTIL HFA;VENTOLIN HFA) 108 (90 BASE) MCG/ACT inhaler Inhale 2 puffs into the lungs every 6 (six) hours as needed for wheezing or shortness of breath.   Yes [provider]  amLODipine (NORVASC) 10 MG tablet Take 10 mg by mouth daily. 01/23/20  Yes [provider]  Biotin 1000 MCG tablet Take 1,000 mcg by mouth in the morning and at bedtime.   Yes [provider]  cephALEXin (KEFLEX) 500 MG capsule Take 1 capsule (500 mg total) by mouth 2 (two) times daily for 7 days. 02/02/20 02/09/20 Yes Trifan, Kermit Balo, MD  gabapentin (NEURONTIN) 600 MG tablet Take 600 mg by mouth in the morning, at noon, and at bedtime. 05/02/06  Yes [provider]  loperamide (IMODIUM A-D) 2 MG tablet Take 1 tablet (2 mg total) by mouth 4 (four) times daily as needed for diarrhea or loose stools. 01/30/20  Yes Rodolph Bong, MD  meclizine (ANTIVERT) 25 MG tablet Take  1 tablet (25 mg total) by mouth 3 (three) times daily as needed for dizziness. 01/30/20  Yes Rodolph Bong, MD  modafinil (PROVIGIL) 200 MG tablet Take 200 mg by mouth daily.  12/26/19  Yes [provider]  mupirocin ointment (BACTROBAN) 2 % Apply 1 application topically 3 (three) times daily. Apply to scalp 02/04/20  Yes Hoffman, Jessica Ratliff, DO  omeprazole (PRILOSEC) 20 MG capsule Take 20 mg by mouth in the morning and at bedtime.    Yes [provider]  OXcarbazepine (TRILEPTAL) 150 MG tablet Take 1 tablet (150 mg total) by mouth at bedtime. 01/30/20  Yes Rodolph Bong, MD   Oxcarbazepine (TRILEPTAL) 300 MG tablet Take 1 tablet (300 mg total) by mouth daily. 01/31/20  Yes Rodolph Bong, MD  QUEtiapine (SEROQUEL XR) 400 MG 24 hr tablet Take 400 mg by mouth daily. 01/23/20  Yes [provider]  RESTASIS 0.05 % ophthalmic emulsion Place 1 drop into both eyes 2 (two) times daily. 11/11/19  Yes [provider]  sodium chloride 1 g tablet Take 1 g by mouth 2 (two) times daily. 01/23/20  Yes [provider]  tolterodine (DETROL LA) 4 MG 24 hr capsule Take 4 mg by mouth daily.   Yes [provider]  venlafaxine XR (EFFEXOR-XR) 75 MG 24 hr capsule Take 75 mg by mouth daily. 01/23/20  Yes [provider]    Physical Exam: Constitutional: Moderately built and nourished. Vitals:   02/05/20 0215 02/05/20 0230 02/05/20 0315 02/05/20 0400  BP: (!) 149/117 (!) 148/127 (!) 153/101 (!) 162/90  Pulse: 69 66 68 65  Resp: 12 17 13 11   Temp:      TempSrc:      SpO2: 98% 100% 100% 95%   Eyes: Anicteric no pallor. ENMT: No discharge from the ears eyes nose or mouth. Neck: No mass felt.  No neck rigidity. Respiratory: No rhonchi or crepitations. Cardiovascular: S1-S2 heard. Abdomen: Soft nontender bowel sounds present. Musculoskeletal: No edema. Skin: No rash. Neurologic: Alert awake oriented to time place and person.  Moves all extremities 5 x 5. Psychiatric: Appears normal.   Labs on Admission: I have personally reviewed following labs and imaging studies  CBC: Recent Labs  Lab 01/29/20 0706 01/30/20 0429 02/04/20 1906  WBC 8.3 8.8 10.1  NEUTROABS 6.3  --  6.8  HGB 11.7* 11.1* 12.1  HCT 34.9* 34.2* 35.5*  MCV 93.1 94.2 91.0  PLT 349 328 359   Basic Metabolic Panel: Recent Labs  Lab 01/29/20 0706 01/30/20 0429 02/04/20 0132 02/04/20 1906  NA 138 135  --  124*  K 3.8 4.0  --  3.0*  CL 108 108  --  89*  CO2 22 20*  --  21*  GLUCOSE 89 99  --  125*  BUN 8 10  --  9  CREATININE 0.61 0.63  --  0.66  CALCIUM 8.4*  8.4*  --  9.3  MG 1.6* 2.0 1.1*  --    GFR: Estimated Creatinine Clearance: 55.7 mL/min (by C-G formula based on SCr of 0.66 mg/dL). Liver Function Tests: Recent Labs  Lab 01/29/20 0706 02/04/20 1906  AST 26 24  ALT 18 19  ALKPHOS 87 102  BILITOT 0.7 0.3  PROT 6.0* 6.6  ALBUMIN 3.4* 4.0   No results for input(s): LIPASE, AMYLASE in the last 168 hours. No results for input(s): AMMONIA in the last 168 hours. Coagulation Profile: Recent Labs  Lab 02/04/20 1906  INR 0.9  Cardiac Enzymes: No results for input(s): CKTOTAL, CKMB, CKMBINDEX, TROPONINI in the last 168 hours. BNP (last 3 results) No results for input(s): PROBNP in the last 8760 hours. HbA1C: No results for input(s): HGBA1C in the last 72 hours. CBG: Recent Labs  Lab 02/05/20 0108  GLUCAP 124*   Lipid Profile: No results for input(s): CHOL, HDL, LDLCALC, TRIG, CHOLHDL, LDLDIRECT in the last 72 hours. Thyroid Function Tests: No results for input(s): TSH, T4TOTAL, FREET4, T3FREE, THYROIDAB in the last 72 hours. Anemia Panel: No results for input(s): VITAMINB12, FOLATE, FERRITIN, TIBC, IRON, RETICCTPCT in the last 72 hours. Urine analysis:    Component Value Date/Time   COLORURINE STRAW (A) 01/28/2020 2012   APPEARANCEUR CLEAR 01/28/2020 2012   LABSPEC 1.009 01/28/2020 2012   PHURINE 6.0 01/28/2020 2012   GLUCOSEU NEGATIVE 01/28/2020 2012   HGBUR SMALL (A) 01/28/2020 2012   BILIRUBINUR NEGATIVE 01/28/2020 2012   KETONESUR 5 (A) 01/28/2020 2012   PROTEINUR NEGATIVE 01/28/2020 2012   UROBILINOGEN 0.2 04/17/2014 1819   NITRITE NEGATIVE 01/28/2020 2012   LEUKOCYTESUR NEGATIVE 01/28/2020 2012   Sepsis Labs: @LABRCNTIP (procalcitonin:4,lacticidven:4) ) Recent Results (from the past 240 hour(s))  SARS CORONAVIRUS 2 (TAT 6-24 HRS) Nasopharyngeal Nasopharyngeal Swab     Status: None   Collection Time: 01/28/20  9:04 PM   Specimen: Nasopharyngeal Swab  Result Value Ref Range Status   SARS Coronavirus 2  NEGATIVE NEGATIVE Final    Comment: (NOTE) SARS-CoV-2 target nucleic acids are NOT DETECTED. The SARS-CoV-2 RNA is generally detectable in upper and lower respiratory specimens during the acute phase of infection. Negative results do not preclude SARS-CoV-2 infection, do not rule out co-infections with other pathogens, and should not be used as the sole basis for treatment or other patient management decisions. Negative results must be combined with clinical observations, patient history, and epidemiological information. The expected result is Negative. Fact Sheet for Patients: 03/29/20 Fact Sheet for Healthcare Providers: HairSlick.no This test is not yet approved or cleared by the [place] FDA and  has been authorized for detection and/or diagnosis of SARS-CoV-2 by FDA under an Emergency Use Authorization (EUA). This EUA will remain  in effect (meaning this test can be used) for the duration of the COVID-19 declaration under Section 56 4(b)(1) of the Act, 21 U.S.C. section 360bbb-3(b)(1), unless the authorization is terminated or revoked sooner. Performed at [facility], [address], [place], [place]      Radiological Exams on Admission: CT HEAD WO CONTRAST  Result Date: 02/04/2020 CLINICAL DATA:  Possible stroke; speech difficulty. EXAM: CT HEAD WITHOUT CONTRAST TECHNIQUE: Contiguous axial images were obtained from the base of the skull through the vertex without intravenous contrast. COMPARISON:  Brain MRI 01/29/2020, head CT 01/28/2020 FINDINGS: Brain: Stable moderate ill-defined hypoattenuation within the cerebral white matter which is nonspecific, but consistent with chronic small vessel ischemic disease. There is no acute intracranial hemorrhage. No demarcated cortical infarct. No extra-axial fluid collection. No evidence of intracranial mass. No midline shift. Vascular: No hyperdense vessel.  Atherosclerotic calcifications. Skull: Normal. Negative for fracture or focal lesion. Sinuses/Orbits: Visualized orbits show no acute finding. Minimal ethmoid sinus mucosal thickening. No significant mastoid effusion. Other: Right parietal scalp laceration/hematoma. IMPRESSION: 1. No evidence of acute intracranial abnormality. 2. Stable moderate chronic small vessel ischemic disease. 3. Right parietal scalp laceration/hematoma. 4. Minimal ethmoid sinus mucosal thickening. Electronically Signed   By: 03/29/2020 DO   On: 02/04/2020 19:56    EKG: Independently reviewed.  Sinus tachycardia.  Assessment/Plan Principal Problem:   Acute encephalopathy Active Problems:   Seizure ([place])   Essential hypertension   Hypokalemia   Hyponatremia    1. Acute encephalopathy likely precipitated by severe hyponatremia.  Hyponatremia likely from patient drinking a lot of iced tea.  Will restrict fluids follow metabolic panel closely check serum osmolality urine osmolality urine sodium check TSH and cortisol level follow metabolic panel closely.  Patient is on sodium chloride tablets.  Patient did receive 500 cc normal saline bolus in the ER. 2. Recent admission for seizure provoked by multiple electrolyte abnormalities.  Patient states she has been compliant with her Trileptal which we continued. 3. Hypokalemia replace and recheck.  Check magnesium with next blood draw. 4. Multiple sclerosis continue home medications. 5. Hypertension on amlodipine. 6. Recent fall with scalp hematoma appears stable. 7. Anemia appears to be having stable CBC. 8. History of depression on Seroquel and venlafaxine.  If patient's sodium does not improve with fluid restriction patient likely will need to have her medications addressed.  Covid test is pending.  As per patient's daughter who is a physician patient if getting agitated does better with IV Haldol than benzodiazepine.   DVT prophylaxis: Heparin. Code Status: Full  code. Family Communication: Discussed with patient's daughter. Disposition Plan: Home. Consults called: None. Admission status: Observation.   Rise Patience MD [facility] Pager [phone].  If 7PM-7AM, please contact night-coverage [email or web address] Password [facility]  02/05/2020, 4:39 AM

## 2020-02-05 NOTE — Progress Notes (Signed)
[name] [id number] Admission Data: 02/05/2020 4:14 PM Attending Provider: Lonia Blood, MD  TLX:[id number], No Pcp Per  Tara Wade is a 69 y.o. female patient admitted from ED awake, alert  & orientated  X 3,  Full Code, VSS - Blood pressure 124/67, pulse 71, temperature 97.9 F (36.6 C), temperature source Oral, resp. rate 11, height 5\' 3"  (1.6 m), weight 49.7 kg, SpO2 95 %., O2 No c/o shortness of breath, no c/o chest pain, no distress noted. Tele # [id number]  placed and pt is currently running:normal sinus rhythm.  Pt orientation to unit, room and routine. Information packet given to patient/family.  Admission INP armband ID verified with patient/family, and in place. SR up x 2, fall risk assessment complete with patient and family verbalizing understanding of risks associated with falls. Pt verbalizes an understanding of how to use the call bell and to call for help before getting out of bed.  Skin, clean-dry- intact without evidence of bruising, or skin tears.   No evidence of skin break down noted on exam. Pt noted to have abrasion on back of head from a fall per pt. And a scab on her back   Will continue to monitor and assist as needed.  , RN 02/05/2020 4:14 PM

## 2020-02-05 NOTE — ED Provider Notes (Signed)
[facility] EMERGENCY DEPARTMENT Provider Note   CSN: [id number] Arrival date & time: 02/04/20  1845     History No chief complaint on file.   Lisbet Busker Studzinski is a 69 y.o. female.  The history is provided by the patient and a relative.  Altered Mental Status Presenting symptoms: confusion   Severity:  Mild Timing:  Constant Progression:  Improving Chronicity:  New Associated symptoms: headaches   Associated symptoms: no fever and no vomiting    Patient history of MS, seizures, hypertension presents with confusion and difficulty with word finding.  Patient is accompanied by family.  Patient went to neurology physical therapy today for vertigo.  When she was picked up in the afternoon family reports patient appeared to be confused and had some difficulty finding words.  There was no focal weakness the patient reports feeling fatigued.  No new falls.  Patient was just in the hospital recently for a seizure and electrolyte abnormalities. Patient actually lives in [place] and is visiting family. She was seen today by plastic surgeon for recent head wound.    Past Medical History:  Diagnosis Date  . Anxiety   . Hypertension   . Multiple sclerosis (HCC)   . Seizures [facility])     Patient Active Problem List   Diagnosis Date Noted  . Acute encephalopathy 02/05/2020  . BPPV (benign paroxysmal positional vertigo), left 01/30/2020  . Hypokalemia   . Hypomagnesemia   . Dizziness   . Nonintractable headache   . Seizure (HCC) 01/28/2020  . Electrolyte abnormality 01/28/2020  . Multiple sclerosis (HCC) 01/28/2020  . Essential hypertension 01/28/2020  . Hematoma of scalp     Past Surgical History:  Procedure Laterality Date  . ABDOMINAL HYSTERECTOMY    . EYE SURGERY    . KNEE SURGERY Left      OB History   No obstetric history on file.     Family History  Problem Relation Age of Onset  . Multiple sclerosis Brother   . Seizures Nephew     Social  History   Tobacco Use  . Smoking status: Former Smoker    Packs/day: 1.00    Years: 25.00    Pack years: 25.00    Types: Cigarettes    Quit date: 01/17/2014    Years since quitting: 6.0  . Smokeless tobacco: Never Used  Substance Use Topics  . Alcohol use: Yes    Comment: Socially   . Drug use: No    Home Medications Prior to Admission medications   Medication Sig Start Date End Date Taking? Authorizing Provider  albuterol (PROVENTIL HFA;VENTOLIN HFA) 108 (90 BASE) MCG/ACT inhaler Inhale 2 puffs into the lungs every 6 (six) hours as needed for wheezing or shortness of breath.   Yes [provider]  amLODipine (NORVASC) 10 MG tablet Take 10 mg by mouth daily. 01/23/20  Yes [provider]  Biotin 1000 MCG tablet Take 1,000 mcg by mouth in the morning and at bedtime.   Yes [provider]  cephALEXin (KEFLEX) 500 MG capsule Take 1 capsule (500 mg total) by mouth 2 (two) times daily for 7 days. 02/02/20 02/09/20 Yes Trifan, Kermit Balo, MD  gabapentin (NEURONTIN) 600 MG tablet Take 600 mg by mouth in the morning, at noon, and at bedtime. 05/02/06  Yes [provider]  loperamide (IMODIUM A-D) 2 MG tablet Take 1 tablet (2 mg total) by mouth 4 (four) times daily as needed for diarrhea or loose stools. 01/30/20  Yes Eugenie Filler, MD  meclizine (ANTIVERT) 25 MG tablet Take 1 tablet (25 mg total) by mouth 3 (three) times daily as needed for dizziness. 01/30/20  Yes Eugenie Filler, MD  modafinil (PROVIGIL) 200 MG tablet Take 200 mg by mouth daily.  12/26/19  Yes [provider]  mupirocin ointment (BACTROBAN) 2 % Apply 1 application topically 3 (three) times daily. Apply to scalp 02/04/20  Yes Hoffman, Jessica Ratliff, DO  omeprazole (PRILOSEC) 20 MG capsule Take 20 mg by mouth in the morning and at bedtime.    Yes [provider]  OXcarbazepine (TRILEPTAL) 150 MG tablet Take 1 tablet (150 mg total) by mouth at bedtime. 01/30/20  Yes Eugenie Filler, MD  Oxcarbazepine (TRILEPTAL) 300 MG tablet Take 1 tablet (300 mg total) by mouth daily. 01/31/20  Yes Eugenie Filler, MD  QUEtiapine (SEROQUEL XR) 400 MG 24 hr tablet Take 400 mg by mouth daily. 01/23/20  Yes [provider]  RESTASIS 0.05 % ophthalmic emulsion Place 1 drop into both eyes 2 (two) times daily. 11/11/19  Yes [provider]  sodium chloride 1 g tablet Take 1 g by mouth 2 (two) times daily. 01/23/20  Yes [provider]  tolterodine (DETROL LA) 4 MG 24 hr capsule Take 4 mg by mouth daily.   Yes [provider]  venlafaxine XR (EFFEXOR-XR) 75 MG 24 hr capsule Take 75 mg by mouth daily. 01/23/20  Yes [provider]    Allergies    Patient has no known allergies.  Review of Systems   Review of Systems  Constitutional: Positive for fatigue. Negative for fever.  Gastrointestinal: Negative for diarrhea and vomiting.  Neurological: Positive for headaches.  Psychiatric/Behavioral: Positive for confusion.  All other systems reviewed and are negative.   Physical Exam Updated Vital Signs BP (!) 155/89   Pulse 72   Temp 97.9 F (36.6 C) (Oral)   Resp 15   SpO2 97%   Physical Exam  CONSTITUTIONAL: Well developed/well nourished HEAD: Normocephalic, bandage noted to head EYES: EOMI/PERRL, no nystagmus ENMT: Mucous membranes moist NECK: supple no meningeal signs CV: S1/S2 noted, no murmurs/rubs/gallops noted LUNGS: Lungs are clear to auscultation bilaterally, no apparent distress ABDOMEN: soft, nontender NEURO:Awake/alert, face symmetric, no arm  drift is noted  equal hand grips bilaterally Equal strength in her legs but about 3/5 strength in each leg equally Cranial nerves 3/4/5/6/03/27/09/11/12 tested and intact Sensation to light touch intact in all extremities EXTREMITIES: pulses normal, full ROM, no tenderness SKIN: warm, color normal PSYCH: no abnormalities of mood noted  ED Results / Procedures / Treatments    Labs (all labs ordered are listed, but only abnormal results are displayed) Labs Reviewed  CBC - Abnormal; Notable for the following components:      Result Value   HCT 35.5 (*)    All other components within normal limits  DIFFERENTIAL - Abnormal; Notable for the following components:   Abs Immature Granulocytes 0.08 (*)    All other components within normal limits  COMPREHENSIVE METABOLIC PANEL - Abnormal; Notable for the following components:   Sodium 124 (*)    Potassium 3.0 (*)    Chloride 89 (*)    CO2 21 (*)    Glucose, Bld 125 (*)    All other components within normal limits  MAGNESIUM - Abnormal; Notable for the following components:   Magnesium 1.1 (*)    All other components within normal limits  CBG MONITORING, ED - Abnormal;  Notable for the following components:   Glucose-Capillary 124 (*)    All other components within normal limits  SARS CORONAVIRUS 2 (TAT 6-24 HRS)  PROTIME-INR  APTT    EKG EKG Interpretation  Date/Time:  Tuesday Feb 04 2020 18:52:54 EDT Ventricular Rate:  101 PR Interval:  158 QRS Duration: 84 QT Interval:  364 QTC Calculation: 471 R Axis:   77 Text Interpretation: Sinus tachycardia Otherwise normal ECG No significant change since last tracing Confirmed by Zadie Rhine ([id number]) on 02/05/2020 1:12:35 AM   Radiology CT HEAD WO CONTRAST  Result Date: 02/04/2020 CLINICAL DATA:  Possible stroke; speech difficulty. EXAM: CT HEAD WITHOUT CONTRAST TECHNIQUE: Contiguous axial images were obtained from the base of the skull through the vertex without intravenous contrast. COMPARISON:  Brain MRI 01/29/2020, head CT 01/28/2020 FINDINGS: Brain: Stable moderate ill-defined hypoattenuation within the cerebral white matter which is nonspecific, but consistent with chronic small vessel ischemic disease. There is no acute intracranial hemorrhage. No demarcated cortical infarct. No extra-axial fluid collection. No evidence of intracranial mass. No  midline shift. Vascular: No hyperdense vessel. Atherosclerotic calcifications. Skull: Normal. Negative for fracture or focal lesion. Sinuses/Orbits: Visualized orbits show no acute finding. Minimal ethmoid sinus mucosal thickening. No significant mastoid effusion. Other: Right parietal scalp laceration/hematoma. IMPRESSION: 1. No evidence of acute intracranial abnormality. 2. Stable moderate chronic small vessel ischemic disease. 3. Right parietal scalp laceration/hematoma. 4. Minimal ethmoid sinus mucosal thickening. Electronically Signed   By: Jackey Loge DO   On: 02/04/2020 19:56    Procedures Procedures   Medications Ordered in ED Medications  sodium chloride flush (NS) 0.9 % injection 3 mL (3 mLs Intravenous Not Given 02/05/20 0103)  magnesium sulfate IVPB 2 g 50 mL (has no administration in time range)  sodium chloride 0.9 % bolus 500 mL (0 mLs Intravenous Stopped 02/05/20 0204)    ED Course  I have reviewed the triage vital signs and the nursing notes.  Pertinent labs & imaging results that were available during my care of the patient were reviewed by me and considered in my medical decision making (see chart for details).    MDM Rules/Calculators/A&P                       This patient presents to the ED for concern of altered mental status, this involves an extensive number of treatment options, and is a complaint that carries with it a high risk of complications and morbidity.  The differential diagnosis includes stroke, electrolyte abnormality   Lab Tests:   I Ordered, reviewed, and interpreted labs, which included electrolytes, complete blood count  Medicines ordered:   I ordered medication 500 mL bolus normal saline for dehydration  Imaging Studies ordered:   I ordered imaging studies which included CT head   I independently visualized and interpreted imaging which showed no acute findings  Additional history obtained:   Additional history obtained from family  member  Previous records obtained and reviewed   Consultations Obtained:   I consulted [facility] hospitalist and discussed lab and imaging findings  Reevaluation:  After the interventions stated above, I reevaluated the patient and found patient is stable  Critical Interventions:  . IV fluid  Patient with recent admission for seizure and electrolyte abnormality.  She was just discharged on May 13.  Today began having some confusion and difficulty with word finding.  Sodium has dropped from 135-124 in the past 5 days Patient also has hypomagnesemia. I suspect that  her confusion could be related to the acute drop in her sodium.  Patient reports decreased p.o. fluids.  She is also on Trileptal which is known for hyponatremia Patient be admitted for treatment.  500 mL bolus normal saline has been ordered here. At this time no obvious signs of acute stroke.  CT head is negative Discussed with Dr. Toniann Fail for admission Final Clinical Impression(s) / ED Diagnoses Final diagnoses:  Hyponatremia  Metabolic encephalopathy    Rx / DC Orders ED Discharge Orders    None       Zadie Rhine, MD 02/05/20 0225

## 2020-02-06 ENCOUNTER — Ambulatory Visit: Payer: Medicare (Managed Care) | Admitting: Physical Therapy

## 2020-02-06 DIAGNOSIS — I10 Essential (primary) hypertension: Secondary | ICD-10-CM

## 2020-02-06 LAB — CBC
HCT: 37.1 % (ref 36.0–46.0)
Hemoglobin: 12.1 g/dL (ref 12.0–15.0)
MCH: 30.9 pg (ref 26.0–34.0)
MCHC: 32.6 g/dL (ref 30.0–36.0)
MCV: 94.9 fL (ref 80.0–100.0)
Platelets: 358 10*3/uL (ref 150–400)
RBC: 3.91 MIL/uL (ref 3.87–5.11)
RDW: 13 % (ref 11.5–15.5)
WBC: 7.1 10*3/uL (ref 4.0–10.5)
nRBC: 0 % (ref 0.0–0.2)

## 2020-02-06 LAB — BASIC METABOLIC PANEL
Anion gap: 6 (ref 5–15)
BUN: 10 mg/dL (ref 8–23)
CO2: 23 mmol/L (ref 22–32)
Calcium: 9.1 mg/dL (ref 8.9–10.3)
Chloride: 105 mmol/L (ref 98–111)
Creatinine, Ser: 0.76 mg/dL (ref 0.44–1.00)
GFR calc Af Amer: >60 mL/min (ref >60)
GFR calc non Af Amer: >60 mL/min (ref >60)
Glucose, Bld: 95 mg/dL (ref 70–99)
Potassium: 4.8 mmol/L (ref 3.5–5.1)
Sodium: 134 mmol/L — ABNORMAL LOW (ref 135–145)

## 2020-02-06 LAB — OSMOLALITY: Osmolality: 283 mOsm/kg (ref 275–295)

## 2020-02-06 LAB — MAGNESIUM: Magnesium: 1.8 mg/dL (ref 1.7–2.4)

## 2020-02-06 MED ORDER — BACITRACIN ZINC 500 UNIT/GM EX OINT
1.0000 "application " | TOPICAL_OINTMENT | Freq: Three times a day (TID) | CUTANEOUS | Status: DC
  Administered 2020-02-06: 1 via TOPICAL
  Filled 2020-02-06: qty 28.4

## 2020-02-06 NOTE — Care Management (Signed)
Pt deemed stable for discharge home today. CM confirmed with pt's daughter via phone (daughter at pt's bedside) that pt will continue with neuro outpt PT and OT.  CM made referral via Epic.  Discharge orders written - no other TOC needs determined - CM signing off

## 2020-02-06 NOTE — Evaluation (Signed)
Physical Therapy Evaluation Patient Details Name: Tara Wade MRN: [id number] DOB: Apr 08, 1951 Today's Date: 02/06/2020   History of Present Illness  69 yo admitted with AMS with hyponatremia. Recent D/C 5/13 after admission for electrolyte abnormality leading to Sz. PMhx: anxiety, MS, HTN, Sz  Clinical Impression   Pt presents with an overall decrease in balance and safety awarenss secondary to above. PTA, pt lives in [place] and is visiting family in town, has been staying with family for several weeks since last hospital stay. Educ on importance of supervision with stairs, showering etc for safety to prevent falls. Educated on seriousness of falls and the importance of working on mobility and safety awarenss to decrease fall risk. Pt DGI score 18 indicating high fall risk. Today, pt able to amb 400 supervision with consistent verbal cues for saftey. Pt would benefit from outpatient physical therapy services upon discharge to next level of care.     Follow Up Recommendations Outpatient PT    Equipment Recommendations  None recommended by PT    Recommendations for Other Services       Precautions / Restrictions Precautions Precautions: Fall Restrictions Weight Bearing Restrictions: No      Mobility  Bed Mobility Overal bed mobility: Independent                Transfers Overall transfer level: Independent                  Ambulation/Gait Ambulation/Gait assistance: Supervision Gait Distance (Feet): 400 Feet Assistive device: None Gait Pattern/deviations: Step-through pattern;Decreased dorsiflexion - left;Drifts right/left;Narrow base of support;Decreased stride length     General Gait Details: Pt required supervision for safety due to increased impulsivity and speed with movement.  Stairs Stairs: Yes Stairs assistance: Min guard Stair Management: One rail Right;Forwards Number of Stairs: 12 General stair comments: Flight of steps, LOB ascending stairs  with one step, pt required assistance to correct LOB. Pt reported that this was a "one time thing".  Wheelchair Mobility    Modified Rankin (Stroke Patients Only)       Balance Overall balance assessment: Needs assistance Sitting-balance support: Feet supported;No upper extremity supported Sitting balance-Leahy Scale: Normal     Standing balance support: No upper extremity supported Standing balance-Leahy Scale: Fair Standing balance comment: good static, fair dynamic               High Level Balance Comments: Completed modified DGI, required supervision-min guard during test. Standardized Balance Assessment Standardized Balance Assessment : Dynamic Gait Index   Dynamic Gait Index Level Surface: Mild Impairment Change in Gait Speed: Mild Impairment Gait with Horizontal Head Turns: Mild Impairment Gait with Vertical Head Turns: Normal Gait and Pivot Turn: Mild Impairment Step Over Obstacle: Normal Step Around Obstacles: Mild Impairment(used markers on floor) Steps: Mild Impairment Total Score: 18       Pertinent Vitals/Pain Pain Assessment: No/denies pain    Home Living Family/patient expects to be discharged to:: Private residence Living Arrangements: Children Available Help at Discharge: Family;Available PRN/intermittently Type of Home: House Home Access: Stairs to enter Entrance Stairs-Rails: None Entrance Stairs-Number of Steps: 3 Home Layout: Two level;Bed/bath upstairs Home Equipment: Walker - 2 wheels Additional Comments: currently visiting adult children in [place], from [place]    Prior Function                 Hand Dominance        Extremity/Trunk Assessment   Upper Extremity Assessment Upper Extremity Assessment: Overall WFL for tasks  assessed    Lower Extremity Assessment Lower Extremity Assessment: Overall WFL for tasks assessed    Cervical / Trunk Assessment Cervical / Trunk Assessment: Normal  Communication      Cognition  Arousal/Alertness: Awake/alert Behavior During Therapy: WFL for tasks assessed/performed Overall Cognitive Status: History of cognitive impairments - at baseline Area of Impairment: Safety/judgement                         Safety/Judgement: Decreased awareness of safety;Decreased awareness of deficits     General Comments: pt reports she doesn't need a walker. Pt demonstrates increased impulsivity with amb, required supervision for saftey due to decreased safety awareness and decreased awareness of deficits      General Comments General comments (skin integrity, edema, etc.): Pt reports 6-7 falls in last year, reports due to her "MS and weakness".    Exercises     Assessment/Plan    PT Assessment All further PT needs can be met in the next venue of care  PT Problem List Decreased activity tolerance;Decreased balance;Decreased mobility;Decreased safety awareness       PT Treatment Interventions      PT Goals (Current goals can be found in the Care Plan section)  Acute Rehab PT Goals PT Goal Formulation: All assessment and education complete, DC therapy    Frequency     Barriers to discharge   Pt lives alone in [place], Pt able to stay with son and daughter in-law in town temporarily.    Co-evaluation               AM-PAC PT "6 Clicks" Mobility  Outcome Measure Help needed turning from your back to your side while in a flat bed without using bedrails?: None Help needed moving from lying on your back to sitting on the side of a flat bed without using bedrails?: None Help needed moving to and from a bed to a chair (including a wheelchair)?: None Help needed standing up from a chair using your arms (e.g., wheelchair or bedside chair)?: None Help needed to walk in hospital room?: None Help needed climbing 3-5 steps with a railing? : A Little 6 Click Score: 23    End of Session Equipment Utilized During Treatment: Gait belt Activity Tolerance: Patient  tolerated treatment well Patient left: in bed;with family/visitor present Nurse Communication: Mobility status PT Visit Diagnosis: Unsteadiness on feet (R26.81);History of falling (Z91.81)    Time: 2500-3704 PT Time Calculation (min) (ACUTE ONLY): 16 min   Charges:   PT Evaluation $PT Eval Moderate Complexity: 1 Mod          Keller Dixon SPT 02/06/2020   Rolland Porter 02/06/2020, 12:14 PM

## 2020-02-06 NOTE — Discharge Summary (Signed)
PATIENT DETAILS Name: Tara Wade Age: 69 y.o. Sex: female Date of Birth: [birth year] MRN: [id number]. Admitting Physician: Maretta Bees, MD ONG:[id number], No Pcp Per  Admit Date: 02/04/2020 Discharge date: 02/06/2020  Recommendations for Outpatient Follow-up:  1. Follow up with PCP in 1-2 weeks 2. Please obtain CMP/CBC in one week 3. If she continues to have significant hyponatremia-may need to look at discontinuing antidepressants.  Admitted From:  Home  Disposition: Home   Home Health: No  Equipment/Devices: None  Discharge Condition: Stable  CODE STATUS: FULL CODE  Diet recommendation:  Diet Order            Diet - low sodium heart healthy        Diet regular Room service appropriate? Yes; Fluid consistency: Thin  Diet effective now               Brief Summary: See H&P, Labs, Consult and Test reports for all details in brief, patient is a 69 year old female who was recently hospitalized from May 11-13 for seizures thought to be secondary to electrolyte abnormalities-was brought to the hospital for acute metabolic encephalopathy in the setting of hyponatremia.  See below for further details.  Brief Hospital Course: Acute metabolic encephalopathy: Thought to be secondary to hyponatremia-she is completely awake and alert after correction of her electrolyte abnormalities.  Acute on chronic hyponatremia: Thought to be secondary to excessive free water/sweet tea intake.  Patient does acknowledge drinking excessive amount of fluids a day-she cannot give me exact estimate-but knows that she drinks quite "a lot".  TSH/cortisol levels within normal limits.  Serum osmolality and urine osmolality are both low-she was placed on fluid restriction-and other supportive care-her electrolytes have improved-sodium levels are back to usual baseline.  She was counseled regarding importance of limiting excessive fluid intake-suggested that she stay on a allowance of 1.5 L of  fluid a day.  I have asked her to follow-up with her primary care practitioner in 1 week for repeat chemistry panel checked to ensure that her electrolytes are within range.  However in the future if her hyponatremia persists-or reoccurs-may need to start thinking about changing her antidepressant medications.  History of seizure: Continue Trileptal-she had breakthrough seizures last admission due to electrolyte abnormalities.  Right parietal scalp laceration: Due to her seizure with fall on her last admission-she apparently has a follow-up with plastic surgery.  History of MS: Continue home medications.  History of depression: Continue Seroquel and venlafaxine  HTN: Relatively well controlled this morning-continue amlodipine-follow with PCP for further optimization.  Procedures None  Discharge Diagnoses:  Principal Problem:   Acute encephalopathy Active Problems:   Seizure [facility])   Essential hypertension   Hypokalemia   Hyponatremia   Discharge Instructions:  Activity:  As tolerated with Full fall precautions use walker/cane & assistance as needed  Discharge Instructions    Call MD for:  extreme fatigue   Complete by: As directed    Call MD for:  persistant dizziness or light-headedness   Complete by: As directed    Diet - low sodium heart healthy   Complete by: As directed    Fluid restriction-1.5 L a day   Discharge instructions   Complete by: As directed    Follow with Primary MD in 1-2 weeks  Please get a complete blood count and chemistry panel checked by your Primary MD at your next visit, and again as instructed by your Primary MD.  Get Medicines reviewed and adjusted: Please take all your  medications with you for your next visit with your Primary MD  Laboratory/radiological data: Please request your Primary MD to go over all hospital tests and procedure/radiological results at the follow up, please ask your Primary MD to get all Hospital records sent to his/her  office.  In some cases, they will be blood work, cultures and biopsy results pending at the time of your discharge. Please request that your primary care M.D. follows up on these results.  Also Note the following: If you experience worsening of your admission symptoms, develop shortness of breath, life threatening emergency, suicidal or homicidal thoughts you must seek medical attention immediately by calling 911 or calling your MD immediately  if symptoms less severe.  You must read complete instructions/literature along with all the possible adverse reactions/side effects for all the Medicines you take and that have been prescribed to you. Take any new Medicines after you have completely understood and accpet all the possible adverse reactions/side effects.   Do not drive when taking Pain medications or sleeping medications (Benzodaizepines)  Do not take more than prescribed Pain, Sleep and Anxiety Medications. It is not advisable to combine anxiety,sleep and pain medications without talking with your primary care practitioner  Special Instructions: If you have smoked or chewed Tobacco  in the last 2 yrs please stop smoking, stop any regular Alcohol  and or any Recreational drug use.  Wear Seat belts while driving.  Please note: You were cared for by a hospitalist during your hospital stay. Once you are discharged, your primary care physician will handle any further medical issues. Please note that NO REFILLS for any discharge medications will be authorized once you are discharged, as it is imperative that you return to your primary care physician (or establish a relationship with a primary care physician if you do not have one) for your post hospital discharge needs so that they can reassess your need for medications and monitor your lab values.   Increase activity slowly   Complete by: As directed      Allergies as of 02/06/2020   No Known Allergies     Medication List    TAKE these  medications   albuterol 108 (90 Base) MCG/ACT inhaler Commonly known as: VENTOLIN HFA Inhale 2 puffs into the lungs every 6 (six) hours as needed for wheezing or shortness of breath.   amLODipine 10 MG tablet Commonly known as: NORVASC Take 10 mg by mouth daily.   Aubagio 14 MG Tabs Generic drug: Teriflunomide Take 14 mg by mouth daily.   Biotin 5000 MCG Caps Take 5,000 mcg by mouth in the morning and at bedtime.   busPIRone 7.5 MG tablet Commonly known as: BUSPAR Take 7.5 mg by mouth 2 (two) times daily.   cephALEXin 500 MG capsule Commonly known as: KEFLEX Take 1 capsule (500 mg total) by mouth 2 (two) times daily for 7 days.   loperamide 2 MG tablet Commonly known as: Imodium A-D Take 1 tablet (2 mg total) by mouth 4 (four) times daily as needed for diarrhea or loose stools.   meclizine 25 MG tablet Commonly known as: ANTIVERT Take 1 tablet (25 mg total) by mouth 3 (three) times daily as needed for dizziness.   modafinil 200 MG tablet Commonly known as: PROVIGIL Take 200 mg by mouth daily.   mupirocin ointment 2 % Commonly known as: BACTROBAN Apply 1 application topically 3 (three) times daily. Apply to scalp   Neurontin 600 MG tablet Generic drug: gabapentin Take 600  mg by mouth in the morning, at noon, and at bedtime.   omeprazole 20 MG capsule Commonly known as: PRILOSEC Take 20 mg by mouth in the morning and at bedtime.   OXcarbazepine 150 MG tablet Commonly known as: TRILEPTAL Take 1 tablet (150 mg total) by mouth at bedtime.   Oxcarbazepine 300 MG tablet Commonly known as: TRILEPTAL Take 1 tablet (300 mg total) by mouth daily.   QUEtiapine 400 MG 24 hr tablet Commonly known as: SEROQUEL XR Take 400 mg by mouth daily.   Restasis 0.05 % ophthalmic emulsion Generic drug: cycloSPORINE Place 1 drop into both eyes 2 (two) times daily.   sodium chloride 1 g tablet Take 1 g by mouth 2 (two) times daily.   tolterodine 4 MG 24 hr capsule Commonly  known as: DETROL LA Take 4 mg by mouth daily.   venlafaxine XR 75 MG 24 hr capsule Commonly known as: EFFEXOR-XR Take 75 mg by mouth daily.       No Known Allergies   Consultations:  None   Other Procedures/Studies: CT HEAD WO CONTRAST  Result Date: 02/04/2020 CLINICAL DATA:  Possible stroke; speech difficulty. EXAM: CT HEAD WITHOUT CONTRAST TECHNIQUE: Contiguous axial images were obtained from the base of the skull through the vertex without intravenous contrast. COMPARISON:  Brain MRI 01/29/2020, head CT 01/28/2020 FINDINGS: Brain: Stable moderate ill-defined hypoattenuation within the cerebral white matter which is nonspecific, but consistent with chronic small vessel ischemic disease. There is no acute intracranial hemorrhage. No demarcated cortical infarct. No extra-axial fluid collection. No evidence of intracranial mass. No midline shift. Vascular: No hyperdense vessel. Atherosclerotic calcifications. Skull: Normal. Negative for fracture or focal lesion. Sinuses/Orbits: Visualized orbits show no acute finding. Minimal ethmoid sinus mucosal thickening. No significant mastoid effusion. Other: Right parietal scalp laceration/hematoma. IMPRESSION: 1. No evidence of acute intracranial abnormality. 2. Stable moderate chronic small vessel ischemic disease. 3. Right parietal scalp laceration/hematoma. 4. Minimal ethmoid sinus mucosal thickening. Electronically Signed   By: Jackey Loge DO   On: 02/04/2020 19:56   CT Head Wo Contrast  Result Date: 01/28/2020 CLINICAL DATA:  Status post trauma. EXAM: CT HEAD WITHOUT CONTRAST TECHNIQUE: Contiguous axial images were obtained from the base of the skull through the vertex without intravenous contrast. COMPARISON:  April 17, 2014 FINDINGS: Brain: There is mild cerebral atrophy with widening of the extra-axial spaces and ventricular dilatation. There are areas of decreased attenuation within the white matter tracts of the supratentorial brain,  consistent with microvascular disease changes. Vascular: No hyperdense vessel or unexpected calcification. Skull: Normal. Negative for fracture or focal lesion. Sinuses/Orbits: No acute finding. Other: There is moderate to marked severity right occipital scalp soft tissue swelling with associated scalp hematoma. IMPRESSION: 1. Moderate to marked severity right occipital scalp soft tissue swelling with associated scalp hematoma. 2. No acute intracranial abnormality. Electronically Signed   By: Aram Candela M.D.   On: 01/28/2020 19:13   MR BRAIN WO CONTRAST  Result Date: 01/29/2020 CLINICAL DATA:  Seizure, previously seizure free of medication, history of multiple sclerosis EXAM: MRI HEAD WITHOUT CONTRAST TECHNIQUE: Multiplanar, multiecho pulse sequences of the brain and surrounding structures were obtained without intravenous contrast. COMPARISON:  None. FINDINGS: Brain: There is no acute infarction or intracranial hemorrhage. There is no intracranial mass, mass effect, or edema. There is no hydrocephalus or extra-axial fluid collection. Patchy and confluent areas of T2 hyperintensity in the supratentorial and pontine white matter is nonspecific but probably reflects moderate chronic microvascular ischemic changes. There  are chronic small vessel infarcts adjacent to the posterior left lateral ventricle. Vascular: Major vessel flow voids at the skull base are preserved. Skull and upper cervical spine: Normal marrow signal is preserved. Sinuses/Orbits: Mild mucosal thickening. Bilateral lens replacements. Other: Sella is unremarkable. Mastoid air cells are clear. Right posterior parietal scalp soft tissue swelling. IMPRESSION: No evidence of recent infarction, hemorrhage, or mass. Moderate chronic microvascular ischemic changes. Electronically Signed   By: Guadlupe Spanish M.D.   On: 01/29/2020 16:38     TODAY-DAY OF DISCHARGE:  Subjective:   Tara Wade today has no headache,no chest abdominal pain,no new  weakness tingling or numbness, feels much better wants to go home today.   Objective:   Blood pressure (!) 155/88, pulse 68, temperature 97.8 F (36.6 C), resp. rate 11, height 5\' 3"  (1.6 m), weight 49.7 kg, SpO2 95 %.  Intake/Output Summary (Last 24 hours) at 02/06/2020 1044 Last data filed at 02/06/2020 0400 Gross per 24 hour  Intake 1267.49 ml  Output --  Net 1267.49 ml   Filed Weights   02/05/20 1612  Weight: 49.7 kg    Exam: Awake Alert, Oriented *3, No new F.N deficits, Normal affect [place].AT,PERRAL Supple Neck,No JVD, No cervical lymphadenopathy appriciated.  Symmetrical Chest wall movement, Good air movement bilaterally, CTAB RRR,No Gallops,Rubs or new Murmurs, No Parasternal Heave +ve B.Sounds, Abd Soft, Non tender, No organomegaly appriciated, No rebound -guarding or rigidity. No Cyanosis, Clubbing or edema, No new Rash or bruise   PERTINENT RADIOLOGIC STUDIES: CT HEAD WO CONTRAST  Result Date: 02/04/2020 CLINICAL DATA:  Possible stroke; speech difficulty. EXAM: CT HEAD WITHOUT CONTRAST TECHNIQUE: Contiguous axial images were obtained from the base of the skull through the vertex without intravenous contrast. COMPARISON:  Brain MRI 01/29/2020, head CT 01/28/2020 FINDINGS: Brain: Stable moderate ill-defined hypoattenuation within the cerebral white matter which is nonspecific, but consistent with chronic small vessel ischemic disease. There is no acute intracranial hemorrhage. No demarcated cortical infarct. No extra-axial fluid collection. No evidence of intracranial mass. No midline shift. Vascular: No hyperdense vessel. Atherosclerotic calcifications. Skull: Normal. Negative for fracture or focal lesion. Sinuses/Orbits: Visualized orbits show no acute finding. Minimal ethmoid sinus mucosal thickening. No significant mastoid effusion. Other: Right parietal scalp laceration/hematoma. IMPRESSION: 1. No evidence of acute intracranial abnormality. 2. Stable moderate chronic small  vessel ischemic disease. 3. Right parietal scalp laceration/hematoma. 4. Minimal ethmoid sinus mucosal thickening. Electronically Signed   By: 03/29/2020 DO   On: 02/04/2020 19:56     PERTINENT LAB RESULTS: CBC: Recent Labs    02/05/20 0437 02/06/20 0457  WBC 8.0 7.1  HGB 11.9* 12.1  HCT 35.7* 37.1  PLT 361 358   CMET CMP     Component Value Date/Time   NA 134 (L) 02/06/2020 0457   K 4.8 02/06/2020 0457   CL 105 02/06/2020 0457   CO2 23 02/06/2020 0457   GLUCOSE 95 02/06/2020 0457   BUN 10 02/06/2020 0457   CREATININE 0.76 02/06/2020 0457   CALCIUM 9.1 02/06/2020 0457   PROT 6.6 02/04/2020 1906   ALBUMIN 4.0 02/04/2020 1906   AST 24 02/04/2020 1906   ALT 19 02/04/2020 1906   ALKPHOS 102 02/04/2020 1906   BILITOT 0.3 02/04/2020 1906   GFRNONAA >60 02/06/2020 0457   GFRAA >60 02/06/2020 0457    GFR Estimated Creatinine Clearance: 53.5 mL/min (by C-G formula based on SCr of 0.76 mg/dL). No results for input(s): LIPASE, AMYLASE in the last 72 hours. No results for input(s):  CKTOTAL, CKMB, CKMBINDEX, TROPONINI in the last 72 hours. Invalid input(s): POCBNP No results for input(s): DDIMER in the last 72 hours. No results for input(s): HGBA1C in the last 72 hours. No results for input(s): CHOL, HDL, LDLCALC, TRIG, CHOLHDL, LDLDIRECT in the last 72 hours. Recent Labs    02/05/20 0317  TSH 3.390   No results for input(s): VITAMINB12, FOLATE, FERRITIN, TIBC, IRON, RETICCTPCT in the last 72 hours. Coags: Recent Labs    02/04/20 1906  INR 0.9   Microbiology: Recent Results (from the past 240 hour(s))  SARS CORONAVIRUS 2 (TAT 6-24 HRS) Nasopharyngeal Nasopharyngeal Swab     Status: None   Collection Time: 01/28/20  9:04 PM   Specimen: Nasopharyngeal Swab  Result Value Ref Range Status   SARS Coronavirus 2 NEGATIVE NEGATIVE Final    Comment: (NOTE) SARS-CoV-2 target nucleic acids are NOT DETECTED. The SARS-CoV-2 RNA is generally detectable in upper and  lower respiratory specimens during the acute phase of infection. Negative results do not preclude SARS-CoV-2 infection, do not rule out co-infections with other pathogens, and should not be used as the sole basis for treatment or other patient management decisions. Negative results must be combined with clinical observations, patient history, and epidemiological information. The expected result is Negative. Fact Sheet for Patients: HairSlick.no Fact Sheet for Healthcare Providers: quierodirigir.com This test is not yet approved or cleared by the [place] FDA and  has been authorized for detection and/or diagnosis of SARS-CoV-2 by FDA under an Emergency Use Authorization (EUA). This EUA will remain  in effect (meaning this test can be used) for the duration of the COVID-19 declaration under Section 56 4(b)(1) of the Act, 21 U.S.C. section 360bbb-3(b)(1), unless the authorization is terminated or revoked sooner. Performed at [facility], [address] [place], [place] [address]   SARS CORONAVIRUS 2 (TAT 6-24 HRS) Nasopharyngeal Nasopharyngeal Swab     Status: None   Collection Time: 02/05/20  1:45 AM   Specimen: Nasopharyngeal Swab  Result Value Ref Range Status   SARS Coronavirus 2 NEGATIVE NEGATIVE Final    Comment: (NOTE) SARS-CoV-2 target nucleic acids are NOT DETECTED. The SARS-CoV-2 RNA is generally detectable in upper and lower respiratory specimens during the acute phase of infection. Negative results do not preclude SARS-CoV-2 infection, do not rule out co-infections with other pathogens, and should not be used as the sole basis for treatment or other patient management decisions. Negative results must be combined with clinical observations, patient history, and epidemiological information. The expected result is Negative. Fact Sheet for Patients: HairSlick.no Fact Sheet for  Healthcare Providers: quierodirigir.com This test is not yet approved or cleared by the [place] FDA and  has been authorized for detection and/or diagnosis of SARS-CoV-2 by FDA under an Emergency Use Authorization (EUA). This EUA will remain  in effect (meaning this test can be used) for the duration of the COVID-19 declaration under Section 56 4(b)(1) of the Act, 21 U.S.C. section 360bbb-3(b)(1), unless the authorization is terminated or revoked sooner. Performed at [facility], [address] [place], [place] [address]     FURTHER DISCHARGE INSTRUCTIONS:  Get Medicines reviewed and adjusted: Please take all your medications with you for your next visit with your Primary MD  Laboratory/radiological data: Please request your Primary MD to go over all hospital tests and procedure/radiological results at the follow up, please ask your Primary MD to get all Hospital records sent to his/her office.  In some cases, they  will be blood work, cultures and biopsy results pending at the time of your discharge. Please request that your primary care M.D. goes through all the records of your hospital data and follows up on these results.  Also Note the following: If you experience worsening of your admission symptoms, develop shortness of breath, life threatening emergency, suicidal or homicidal thoughts you must seek medical attention immediately by calling 911 or calling your MD immediately  if symptoms less severe.  You must read complete instructions/literature along with all the possible adverse reactions/side effects for all the Medicines you take and that have been prescribed to you. Take any new Medicines after you have completely understood and accpet all the possible adverse reactions/side effects.   Do not drive when taking Pain medications or sleeping medications (Benzodaizepines)  Do not take more than prescribed Pain, Sleep and Anxiety  Medications. It is not advisable to combine anxiety,sleep and pain medications without talking with your primary care practitioner  Special Instructions: If you have smoked or chewed Tobacco  in the last 2 yrs please stop smoking, stop any regular Alcohol  and or any Recreational drug use.  Wear Seat belts while driving.  Please note: You were cared for by a hospitalist during your hospital stay. Once you are discharged, your primary care physician will handle any further medical issues. Please note that NO REFILLS for any discharge medications will be authorized once you are discharged, as it is imperative that you return to your primary care physician (or establish a relationship with a primary care physician if you do not have one) for your post hospital discharge needs so that they can reassess your need for medications and monitor your lab values.  Total Time spent coordinating discharge including counseling, education and face to face time equals 35 minutes.  SignedJeoffrey Massed 02/06/2020 10:44 AM

## 2020-02-06 NOTE — Progress Notes (Signed)
Tara Wade to be D/C'd Home per MD order.  Discussed with the patient and all questions fully answered.  VSS, Skin clean, dry and intact without evidence of skin break down, no evidence of skin tears noted. IV catheter discontinued intact. Site without signs and symptoms of complications. Dressing and pressure applied.  An After Visit Summary was printed and given to the patient. Patient received prescription.  D/c education completed with patient/family including follow up instructions, medication list, d/c activities limitations if indicated, with other d/c instructions as indicated by MD - patient able to verbalize understanding, all questions fully answered.   Patient instructed to return to ED, call 911, or call MD for any changes in condition.   Patient escorted via WC, and D/C home via private auto.  Tara Wade 02/06/2020 1:20 PM

## 2020-02-07 ENCOUNTER — Encounter: Payer: Self-pay | Admitting: Occupational Therapy

## 2020-02-07 ENCOUNTER — Other Ambulatory Visit: Payer: Self-pay

## 2020-02-07 ENCOUNTER — Ambulatory Visit: Payer: Medicare (Managed Care) | Admitting: Occupational Therapy

## 2020-02-07 DIAGNOSIS — R41840 Attention and concentration deficit: Secondary | ICD-10-CM | POA: Diagnosis present

## 2020-02-07 DIAGNOSIS — R42 Dizziness and giddiness: Secondary | ICD-10-CM | POA: Diagnosis present

## 2020-02-07 DIAGNOSIS — H8112 Benign paroxysmal vertigo, left ear: Secondary | ICD-10-CM | POA: Diagnosis present

## 2020-02-07 DIAGNOSIS — R2681 Unsteadiness on feet: Secondary | ICD-10-CM | POA: Diagnosis present

## 2020-02-07 DIAGNOSIS — M6281 Muscle weakness (generalized): Secondary | ICD-10-CM

## 2020-02-07 DIAGNOSIS — R278 Other lack of coordination: Secondary | ICD-10-CM | POA: Diagnosis present

## 2020-02-07 DIAGNOSIS — H8113 Benign paroxysmal vertigo, bilateral: Secondary | ICD-10-CM | POA: Diagnosis present

## 2020-02-07 NOTE — Therapy (Signed)
[facility] [address] [place], [place], [address] Phone: [phone]   Fax:  [phone]  Occupational Therapy Evaluation  Patient Details  Name: Tara Wade MRN: [id number] Date of Birth: May 27, 1951 Referring Provider (OT): Rodolph Bong   Encounter Date: 02/07/2020  OT End of Session - 02/07/20 1333    Visit Number  1    Number of Visits  1    Authorization Type  Generic Medicare Advantage    OT Start Time  1152    OT Stop Time  1230    OT Time Calculation (min)  38 min    Activity Tolerance  Patient tolerated treatment well    Behavior During Therapy  [facility] for tasks assessed/performed       Past Medical History:  Diagnosis Date  . Anxiety   . Hypertension   . Multiple sclerosis (HCC)   . Seizures (HCC)     Past Surgical History:  Procedure Laterality Date  . ABDOMINAL HYSTERECTOMY    . EYE SURGERY    . KNEE SURGERY Left     There were no vitals filed for this visit.  Subjective Assessment - 02/07/20 1202    Subjective   They are blaming it on my iced tea - they say it is draining my...what you call it, (electrolytes)    Pertinent History  MS, Chogrins, Reynauds, Seizure Disorder    Currently in Pain?  Yes    Pain Score  8     Pain Location  Head    Pain Orientation  Posterior    Pain Descriptors / Indicators  Aching;Headache    Pain Type  Acute pain    Pain Radiating Towards  Around head wound    Pain Onset  1 to 4 weeks ago    Pain Frequency  Constant    Pain Relieving Factors  Tylenol        [facility] OT Assessment - 02/07/20 0001      Assessment   Medical Diagnosis  Fall after seizure    Referring Provider (OT)  Rodolph Bong    Onset Date/Surgical Date  01/28/20    Hand Dominance  Right    Prior Therapy  Not OT      Precautions   Precautions  None      Balance Screen   Has the patient fallen in the past 6 months  Yes    How many times?  1      Prior Function   Level  of Independence  Independent with basic ADLs    Vocation  Retired    Leisure  read, walking, spend time with kids and grandkids      ADL   Eating/Feeding  Independent    Grooming  Independent    Education officer, community    Lower Body Bathing  Modified independent    Upper Body Dressing  Independent    Lower Body Dressing  Independent    Lexicographer - Recruitment consultant -  Hydrographic surveyor with back      IADL   Prior Level of Function Shopping  independently    Shopping  Assistance for transportation   has not driven for 5 years since first seizure   Prior Level of Function Light Housekeeping  Independent  Light Housekeeping  Maintains house alone or with occasional assistance    Prior Level of Function Meal Prep  Independent    Meal Prep  Able to complete simple warm meal prep    Prior Level of Function Medication Managment  Independent    Medication Management  Has difficulty remembering to take medication    Prior Level of Function Financial Management  Independent    Financial Management  Manages financial matters independently (budgets, writes checks, pays rent, bills goes to bank), collects and keeps track of income      Written Expression   Dominant Hand  Right    Handwriting  100% legible      Vision - History   Baseline Vision  Wears glasses only for reading    Visual History  Cataracts    Patient Visual Report  --   Cataract surgery both eyes     Vision Assessment   Eye Alignment  Within Functional Limits    Ocular Range of Motion  Within Functional Limits    Alignment/Gaze Preference  Within Defined Limits    Tracking/Visual Pursuits  Decreased smoothness of horizontal tracking    Saccades  Within functional limits    Visual Fields  No apparent deficits      Activity Tolerance   Activity Tolerance  --    sleeping longer     Cognition   Overall Cognitive Status  Impaired/Different from baseline    Memory  Impaired    Cognition Comments  Spoke with patient's daughter in law, who indicated concentration and memory have been problematic for past 10 years.        Posture/Postural Control   Posture/Postural Control  No significant limitations      Sensation   Light Touch  Appears Intact      Coordination   Gross Motor Movements are Fluid and Coordinated  Yes    Fine Motor Movements are Fluid and Coordinated  No    Finger Nose Finger Test  slight overshooting right    9 Hole Peg Test  Right;Left    Right 9 Hole Peg Test  33.34    Left 9 Hole Peg Test  30.44      ROM / Strength   AROM / PROM / Strength  AROM;Strength      AROM   Overall AROM   Within functional limits for tasks performed    Overall AROM Comments  BUE      Strength   Overall Strength  Deficits    Overall Strength Comments  R sided weakness - MS      Hand Function   Right Hand Gross Grasp  Impaired    Right Hand Grip (lbs)  27.1    Right Hand Lateral Pinch  9 lbs    Left Hand Gross Grasp  Functional    Left Hand Grip (lbs)  44    Left Hand Lateral Pinch  12 lbs                      OT Education - 02/07/20 1332    Education Details  Educated patient and daughter i Roger Kill regarding OT eval findings and recommendations.  Recommend F/U Neuropsychology, SLP, or OT to furtehr assess and address cognitive issues - attention and memory    Person(s) Educated  Patient;Caregiver(s)    Methods  Explanation    Comprehension  Verbalized understanding;Need further instruction  Plan - 02/07/20 1334    Clinical Impression Statement  Patient is a 69 year old woman with prior history of MS and seizures, who on 5/11 had a seizure and fell striking back of head.  Patient also with recent hospitalization for hyponatremia.  Patient presents to OT eval today with significant constant  headache, and wound on posterior right skull, with right sided weakness 4-/5 throughout RUE (30 yr history of MS with Right sided weakness) decreased Right sided coordiantion (mild) and decreased selective attention and memory.  Patient is planning to return to [place] in next two weeks, and may be best served by establishing a relationship with a neuropsychologist, and/or SLP/OT to address cognitive issues which per her daughter in law have been present for the past 10 years.  Patient has wonderful family support bith in [place] and in [place].  No further OT warranted during her remaining time in [place].    OT Occupational Profile and History  Problem Focused Assessment - Including review of records relating to presenting problem    Occupational performance deficits (Please refer to evaluation for details):  IADL's    Body Structure / Function / Physical Skills  FMC;Strength    Cognitive Skills  Attention;Memory    Clinical Decision Making  Limited treatment options, no task modification necessary    Comorbidities Affecting Occupational Performance:  May have comorbidities impacting occupational performance    Modification or Assistance to Complete Evaluation   No modification of tasks or assist necessary to complete eval    Consulted and Agree with Plan of Care  Patient;Family member/caregiver       Patient will benefit from skilled therapeutic intervention in order to improve the following deficits and impairments:   Body Structure / Function / Physical Skills: [facility], Strength Cognitive Skills: Attention, Memory     Visit Diagnosis: Attention and concentration deficit - Plan: Ot plan of care cert/re-cert  Other lack of coordination - Plan: Ot plan of care cert/re-cert  Muscle weakness (generalized) - Plan: Ot plan of care cert/re-cert    Problem List Patient Active Problem List   Diagnosis Date Noted  . Acute encephalopathy 02/05/2020  . Hyponatremia 02/05/2020  . BPPV (benign paroxysmal  positional vertigo), left 01/30/2020  . Hypokalemia   . Hypomagnesemia   . Dizziness   . Nonintractable headache   . Seizure ([place]) 01/28/2020  . Electrolyte abnormality 01/28/2020  . Multiple sclerosis ([place]) 01/28/2020  . Essential hypertension 01/28/2020  . Hematoma of scalp     Mariah Milling, OTR/L 02/07/2020, 1:43 PM  [facility] [address] [place], [place], [address] Phone: [phone]   Fax:  [phone]  Name: Tara Wade MRN: [id number] Date of Birth: [birth year]

## 2020-02-10 ENCOUNTER — Other Ambulatory Visit: Payer: Self-pay

## 2020-02-10 ENCOUNTER — Ambulatory Visit: Payer: Medicare (Managed Care) | Admitting: Physical Therapy

## 2020-02-10 DIAGNOSIS — R42 Dizziness and giddiness: Secondary | ICD-10-CM

## 2020-02-10 DIAGNOSIS — R2681 Unsteadiness on feet: Secondary | ICD-10-CM

## 2020-02-10 DIAGNOSIS — H8113 Benign paroxysmal vertigo, bilateral: Secondary | ICD-10-CM

## 2020-02-10 NOTE — Patient Instructions (Signed)
Sit to Side-Lying    Sit on edge of bed. 1. Turn head 45 to right. 2. Maintain head position and lie down slowly on left side. Hold until symptoms subside. 3. Sit up slowly. Hold until symptoms subside. 4. Turn head 45 to left. 5. Maintain head position and lie down slowly on right side. Hold until symptoms subside. 6. Sit up slowly. Repeat sequence __5__ times per session. Do __3__ sessions per day.   Self Treatment for Right Posterior / Anterior Canalithiasis    Sitting on bed: 1. Turn head 45 right. (a) Lie back slowly, shoulders on pillow, head on bed. (b) Hold _20-30___ seconds. 2. Keeping head on bed, turn head 90 left. Hold _20___ seconds. 3. Roll to left, head on 45 angle down toward bed. Hold __20_ seconds. 4. Sit up on left side of bed. Repeat _3___ times per session. Do _2___ sessions per day.   How to Perform the Epley Maneuver The Epley maneuver is an exercise that relieves symptoms of vertigo. Vertigo is the feeling that you or your surroundings are moving when they are not. When you feel vertigo, you may feel like the room is spinning and have trouble walking. Dizziness is a little different than vertigo. When you are dizzy, you may feel unsteady or light-headed. You can do this maneuver at home whenever you have symptoms of vertigo. You can do it up to 3 times a day until your symptoms go away. Even though the Epley maneuver may relieve your vertigo for a few weeks, it is possible that your symptoms will return. This maneuver relieves vertigo, but it does not relieve dizziness. What are the risks? If it is done correctly, the Epley maneuver is considered safe. Sometimes it can lead to dizziness or nausea that goes away after a short time. If you develop other symptoms, such as changes in vision, weakness, or numbness, stop doing the maneuver and call your health care provider. How to perform the Epley maneuver 1. Sit on the edge of a bed or table with your back  straight and your legs extended or hanging over the edge of the bed or table. 2. Turn your head halfway toward the affected ear or side. 3. Lie backward quickly with your head turned until you are lying flat on your back. You may want to position a pillow under your shoulders. 4. Hold this position for 30 seconds. You may experience an attack of vertigo. This is normal. 5. Turn your head to the opposite direction until your unaffected ear is facing the floor. 6. Hold this position for 30 seconds. You may experience an attack of vertigo. This is normal. Hold this position until the vertigo stops. 7. Turn your whole body to the same side as your head. Hold for another 30 seconds. 8. Sit back up. You can repeat this exercise up to 3 times a day. Follow these instructions at home:  After doing the Epley maneuver, you can return to your normal activities.  Ask your health care provider if there is anything you should do at home to prevent vertigo. He or she may recommend that you: ? Keep your head raised (elevated) with two or more pillows while you sleep. ? Do not sleep on the side of your affected ear. ? Get up slowly from bed. ? Avoid sudden movements during the day. ? Avoid extreme head movement, like looking up or bending over. Contact a health care provider if:  Your vertigo gets worse.  You have other symptoms, including: ? Nausea. ? Vomiting. ? Headache. Get help right away if:  You have vision changes.  You have a severe or worsening headache or neck pain.  You cannot stop vomiting.  You have new numbness or weakness in any part of your body. Summary  Vertigo is the feeling that you or your surroundings are moving when they are not.  The Epley maneuver is an exercise that relieves symptoms of vertigo.  If the Epley maneuver is done correctly, it is considered safe. You can do it up to 3 times a day. This information is not intended to replace advice given to you by your  health care provider. Make sure you discuss any questions you have with your health care provider. Document Revised: 08/18/2017 Document Reviewed: 07/26/2016 Elsevier Patient Education  2020 [employer].

## 2020-02-11 ENCOUNTER — Ambulatory Visit (INDEPENDENT_AMBULATORY_CARE_PROVIDER_SITE_OTHER): Payer: Medicare (Managed Care) | Admitting: Internal Medicine

## 2020-02-11 ENCOUNTER — Encounter: Payer: Self-pay | Admitting: Internal Medicine

## 2020-02-11 ENCOUNTER — Institutional Professional Consult (permissible substitution): Payer: Medicare (Managed Care) | Admitting: Plastic Surgery

## 2020-02-11 VITALS — BP 126/77 | HR 74 | Temp 97.8°F | Ht 63.0 in | Wt 114.0 lb

## 2020-02-11 DIAGNOSIS — S0100XA Unspecified open wound of scalp, initial encounter: Secondary | ICD-10-CM | POA: Diagnosis not present

## 2020-02-11 NOTE — Progress Notes (Signed)
   Subjective:     Patient ID: Tara Wade, female    DOB: 02/07/1951, 69 y.o.   MRN: [id number]  Chief Complaint  Patient presents with  . Follow-up    1 week for open wound of scalp    HPI: The patient is a 69 y.o. female here for follow-up of wound on the back of the head.  Patient is accompanied by her daughter-in-law who helps provide the history.  She states that she currently does daily dressing changes with antibiotic ointment, nonstick pad and head wrap.  She is still taking her antibiotic prescription.  She denies fever/chills, nausea/vomiting, purulent drainage or increased redness/warmth to the wound area.  She states she recently was admitted to the hospital for hyponatremia and would like repeat lab work to check sodium level.  Review of Systems  All other systems reviewed and are negative.    has a past medical history of Anxiety, Hypertension, Multiple sclerosis (HCC), and Seizures (HCC).  has a past surgical history that includes Abdominal hysterectomy; Knee surgery (Left); and Eye surgery.  reports that she quit smoking about 6 years ago. Her smoking use included cigarettes. She has a 25.00 pack-year smoking history. She has never used smokeless tobacco. Objective:   Vital Signs BP 126/77 (BP Location: Left Arm, Patient Position: Sitting, Cuff Size: Normal)   Pulse 74   Temp 97.8 F (36.6 C) (Temporal)   Ht 5\' 3"  (1.6 m)   Wt 114 lb (51.7 kg)   SpO2 99%   BMI 20.19 kg/m  Vital Signs and Nursing Note Reviewed Physical Exam  Constitutional: She is well-developed, well-nourished, and in no distress.  Skin: Skin is warm and dry.  Scalp wound measures: 3.0 x 2.5 x 0.1 cm Dried blood at wound site       Assessment/Plan:     ICD-10-CM   1. Open wound of scalp, unspecified open wound type, initial encounter  S01.00XA    Assessment: Scalp wound on occipital region  Patient reports improvement in wound size and drainage.  The hole in the center has  almost completely closed up.  There is minimal drainage on exam.  I recommended continuing the antibiotic ointment and finishing Keflex course. Patient was hospitalized for hyponatremia and on discharge summary asked for a repeat CMP and CBC.  These labs were ordered per patient request since they are visiting from out of town.  Plan -Continue antibiotic ointment with daily dressing changes -Obtain CMP and CBC -Follow-up as needed  , DO 02/11/2020, 2:48 PM

## 2020-02-11 NOTE — Therapy (Signed)
[facility] [address] [place], [place], [address] Phone: [phone]   Fax:  [phone]  Physical Therapy Treatment  Patient Details  Name: [name] MRN: [id number] Date of Birth: 31-Oct-1950 Referring Provider (PT): Ramiro Harvest, MD   Encounter Date: 02/10/2020  PT End of Session - 02/11/20 2017    Visit Number  2    Number of Visits  4    Date for PT Re-Evaluation  03/06/20    Authorization Type  Generic Medicare Advantage    Authorization Time Period  02-04-20 - 04-05-20    PT Start Time  1103    PT Stop Time  1150    PT Time Calculation (min)  47 min    Activity Tolerance  Patient tolerated treatment well    Behavior During Therapy  [facility] for tasks assessed/performed       Past Medical History:  Diagnosis Date  . Anxiety   . Hypertension   . Multiple sclerosis (HCC)   . Seizures (HCC)     Past Surgical History:  Procedure Laterality Date  . ABDOMINAL HYSTERECTOMY    . EYE SURGERY    . KNEE SURGERY Left     There were no vitals filed for this visit.  Subjective Assessment - 02/11/20 2006    Subjective  Pt reports no major change or improvement in the dizziness after Epley maneuver was administered last week during initial PT eval; pt was hospitalized last week after eval due to electrolyte imbalance - had slurred speech and word finding difficulty    Patient is accompained by:  Family member   son, Tara Wade   Currently in Pain?  Yes    Pain Score  8     Pain Location  Head    Pain Orientation  Posterior    Pain Descriptors / Indicators  Aching;Headache    Pain Type  Acute pain    Pain Onset  1 to 4 weeks ago    Pain Frequency  Intermittent              Vestibular Assessment - 02/11/20 0001      Positional Testing   Dix-Hallpike  Dix-Hallpike Right;Dix-Hallpike Left    Sidelying Test  Sidelying Right;Sidelying Left      Dix-Hallpike Right   Dix-Hallpike Right Duration   approx. 10 secs    Dix-Hallpike Right Symptoms  No nystagmus      Dix-Hallpike Left   Dix-Hallpike Left Duration  none    Dix-Hallpike Left Symptoms  No nystagmus      Sidelying Right   Sidelying Right Duration  none    Sidelying Right Symptoms  No nystagmus      Positional Sensitivities   Supine to Left Side  No dizziness    Supine to Right Side  Mild dizziness    Supine to Sitting  Lightheadedness    Right Hallpike  Mild dizziness    Up from Right Hallpike  Moderate dizziness    Up from Left Hallpike  Lightheadedness       Instructed in SLS on each foot for 10 secs with UE support for safety for HEP                PT Education - 02/11/20 2015    Education Details  pt given information on BPPV etiology and instructed in [employer] exs.  Son given info on Epley - he was also instructed in maneuver as he was present for session;  SLS    Person(s) Educated  Patient;Child(ren)    Methods  Explanation;Demonstration;Handout    Comprehension  Verbalized understanding;Returned demonstration       PT Short Term Goals - 02/05/20 1805      PT SHORT TERM GOAL #1   Title  same as LTG's        PT Long Term Goals - 02/05/20 1805      PT LONG TERM GOAL #1   Title  Pt will have (-) Lt Dix-Hallpike test to indicate resolution of Lt BPPV.    Time  2    Period  Weeks    Status  New    Target Date  02/21/20      PT LONG TERM GOAL #2   Title  Independent in HEP for balance and vestibular exercises.    Time  2    Period  Weeks    Status  New    Target Date  02/21/20            Plan - 02/11/20 2018    Clinical Impression Statement  Pt presents with symptoms consistent with Rt BPPV and with motion sensitivity, however, no nystagmus noted with any positional testing.  Etiology of vertigo may be multi-factorial as pt sustained a concussion due to the fall.    Personal Factors and Comorbidities  Past/Current Experience;Transportation;Comorbidity 2     Comorbidities  MS, seizures, hypokalemia, electrolyte abnormality    Examination-Activity Limitations  Locomotion Level;Transfers;Bend;Squat;Lift;Reach Overhead    Examination-Participation Restrictions  Meal Prep;Cleaning;Community Activity;Interpersonal Relationship;Shop;Laundry    Stability/Clinical Decision Making  Evolving/Moderate complexity    Rehab Potential  Good    PT Frequency  2x / week    PT Duration  2 weeks   pt states she plans on returning to [place] on 02-18-20   PT Treatment/Interventions  Canalith Repostioning;ADLs/Self Care Home Management;Balance training;Therapeutic exercise;Therapeutic activities;Neuromuscular re-education;Vestibular;Gait training;Stair training;Patient/family education    PT Next Visit Plan  recheck Rt BPPV; begin balance HEP    Consulted and Agree with Plan of Care  Patient       Patient will benefit from skilled therapeutic intervention in order to improve the following deficits and impairments:  Dizziness, Decreased balance, Difficulty walking  Visit Diagnosis: Dizziness and giddiness  BPPV (benign paroxysmal positional vertigo), bilateral  Unsteadiness on feet     Problem List Patient Active Problem List   Diagnosis Date Noted  . Acute encephalopathy 02/05/2020  . Hyponatremia 02/05/2020  . BPPV (benign paroxysmal positional vertigo), left 01/30/2020  . Hypokalemia   . Hypomagnesemia   . Dizziness   . Nonintractable headache   . Seizure ([place]) 01/28/2020  . Electrolyte abnormality 01/28/2020  . Multiple sclerosis ([place]) 01/28/2020  . Essential hypertension 01/28/2020  . Hematoma of scalp     Tara Wade, Tara Wade, PT 02/11/2020, 8:35 PM  [facility] [address] [place], [place], [address] Phone: [phone]   Fax:  [phone]  Name: Tara Wade MRN: [id number] Date of Birth: 1951/06/07

## 2020-02-11 NOTE — Patient Instructions (Signed)
Tara Wade It was a pleasure seeing you today.  Please follow the instructions below for your wound care  1) antibiotic dressing changes daily 2) followed by non stick pad 3) wrap with gauze  Please follow up with me as needed.  Call us at [phone] with any questions or concerns

## 2020-02-12 ENCOUNTER — Other Ambulatory Visit: Payer: Self-pay

## 2020-02-12 ENCOUNTER — Telehealth: Payer: Self-pay | Admitting: Internal Medicine

## 2020-02-12 ENCOUNTER — Emergency Department (HOSPITAL_COMMUNITY)
Admission: EM | Admit: 2020-02-12 | Discharge: 2020-02-13 | Disposition: A | Discharge status: Home / Self Care | Payer: Medicare (Managed Care) | Attending: Emergency Medicine | Admitting: Emergency Medicine

## 2020-02-12 ENCOUNTER — Encounter (HOSPITAL_COMMUNITY): Payer: Self-pay | Admitting: Emergency Medicine

## 2020-02-12 DIAGNOSIS — I10 Essential (primary) hypertension: Secondary | ICD-10-CM | POA: Insufficient documentation

## 2020-02-12 DIAGNOSIS — Z79899 Other long term (current) drug therapy: Secondary | ICD-10-CM | POA: Diagnosis not present

## 2020-02-12 DIAGNOSIS — E871 Hypo-osmolality and hyponatremia: Secondary | ICD-10-CM | POA: Insufficient documentation

## 2020-02-12 DIAGNOSIS — Z87891 Personal history of nicotine dependence: Secondary | ICD-10-CM | POA: Diagnosis not present

## 2020-02-12 DIAGNOSIS — G35 Multiple sclerosis: Secondary | ICD-10-CM | POA: Diagnosis not present

## 2020-02-12 DIAGNOSIS — R7989 Other specified abnormal findings of blood chemistry: Secondary | ICD-10-CM | POA: Diagnosis present

## 2020-02-12 LAB — CBC
HCT: 32.5 % — ABNORMAL LOW (ref 36.0–46.0)
Hemoglobin: 11.1 g/dL — ABNORMAL LOW (ref 12.0–15.0)
MCH: 30.9 pg (ref 26.0–34.0)
MCHC: 34.2 g/dL (ref 30.0–36.0)
MCV: 90.5 fL (ref 80.0–100.0)
Platelets: 345 10*3/uL (ref 150–400)
RBC: 3.59 MIL/uL — ABNORMAL LOW (ref 3.87–5.11)
RDW: 12.3 % (ref 11.5–15.5)
WBC: 8.3 10*3/uL (ref 4.0–10.5)
nRBC: 0 % (ref 0.0–0.2)

## 2020-02-12 LAB — BASIC METABOLIC PANEL
Anion gap: 11 (ref 5–15)
BUN: 7 mg/dL — ABNORMAL LOW (ref 8–23)
CO2: 24 mmol/L (ref 22–32)
Calcium: 9 mg/dL (ref 8.9–10.3)
Chloride: 89 mmol/L — ABNORMAL LOW (ref 98–111)
Creatinine, Ser: 0.62 mg/dL (ref 0.44–1.00)
GFR calc Af Amer: >60 mL/min (ref >60)
GFR calc non Af Amer: >60 mL/min (ref >60)
Glucose, Bld: 102 mg/dL — ABNORMAL HIGH (ref 70–99)
Potassium: 3.8 mmol/L (ref 3.5–5.1)
Sodium: 124 mmol/L — ABNORMAL LOW (ref 135–145)

## 2020-02-12 MED ORDER — SODIUM CHLORIDE 0.9% FLUSH: 3.0000 mL | Freq: Once | INTRAVENOUS | Status: DC

## 2020-02-12 NOTE — ED Notes (Signed)
Please call daughter Francina Beery @ [phone] she states she has some additional medical info about her mom--ask for Dr. Jabier Gauss

## 2020-02-12 NOTE — Telephone Encounter (Signed)
Patient received blood work yesterday that showed a sodium level of 124.  She was recently admitted and discharged for hyponatremia.  Her last sodium was 134 on 5/20.  She has had a 10 point drop in the last 6 days.  I called the patient and discussed results with them and son.  I advised she go to the ED for further work-up and evaluation.  Son stated they will go right away.  Lab work in [employer] tab

## 2020-02-12 NOTE — ED Triage Notes (Signed)
Patient arrives to ED with complaints of low sodium levels drawn at her PCP office yesterday. Patient has been admitted from acute encephalopathy and seizures for low sodium in the past. Patient states she been feeling lightheaded.

## 2020-02-13 DIAGNOSIS — E871 Hypo-osmolality and hyponatremia: Secondary | ICD-10-CM | POA: Diagnosis not present

## 2020-02-13 LAB — BASIC METABOLIC PANEL
Anion gap: 11 (ref 5–15)
BUN: 7 mg/dL — ABNORMAL LOW (ref 8–23)
CO2: 26 mmol/L (ref 22–32)
Calcium: 9.7 mg/dL (ref 8.9–10.3)
Chloride: 93 mmol/L — ABNORMAL LOW (ref 98–111)
Creatinine, Ser: 0.55 mg/dL (ref 0.44–1.00)
GFR calc Af Amer: >60 mL/min (ref >60)
GFR calc non Af Amer: >60 mL/min (ref >60)
Glucose, Bld: 94 mg/dL (ref 70–99)
Potassium: 4 mmol/L (ref 3.5–5.1)
Sodium: 130 mmol/L — ABNORMAL LOW (ref 135–145)

## 2020-02-13 LAB — CBG MONITORING, ED: Glucose-Capillary: 98 mg/dL (ref 70–99)

## 2020-02-13 NOTE — ED Notes (Signed)
Daughter was called no answer message left.

## 2020-02-13 NOTE — ED Provider Notes (Signed)
[facility] EMERGENCY DEPARTMENT Provider Note   CSN: [id number] Arrival date & time: 02/12/20  1514     History Chief Complaint  Patient presents with  . Abnormal Lab    Tara Wade is a 69 y.o. female.  HPI    Patient with history of multiple sclerosis, seizures, hypertension, anxiety presents with abnormal labs.  Patient was informed that she had low sodium and was advised to go to emergency department.  Patient reports she has had this issue before.  She reports she has been limiting her fluid restriction except she did drink [facility] while waiting in the waiting room.  No vomiting or diarrhea.  No chest pain/shortness of breath.  She does report mild headache.  Her course is stable.  Nothing improves her symptoms.  She denies any recent seizures in the past several days Past Medical History:  Diagnosis Date  . Anxiety   . Hypertension   . Multiple sclerosis (HCC)   . Seizures [facility])     Patient Active Problem List   Diagnosis Date Noted  . Acute encephalopathy 02/05/2020  . Hyponatremia 02/05/2020  . BPPV (benign paroxysmal positional vertigo), left 01/30/2020  . Hypokalemia   . Hypomagnesemia   . Dizziness   . Nonintractable headache   . Seizure (HCC) 01/28/2020  . Electrolyte abnormality 01/28/2020  . Multiple sclerosis (HCC) 01/28/2020  . Essential hypertension 01/28/2020  . Hematoma of scalp     Past Surgical History:  Procedure Laterality Date  . ABDOMINAL HYSTERECTOMY    . EYE SURGERY    . KNEE SURGERY Left      OB History   No obstetric history on file.     Family History  Problem Relation Age of Onset  . Multiple sclerosis Brother   . Seizures Nephew     Social History   Tobacco Use  . Smoking status: Former Smoker    Packs/day: 1.00    Years: 25.00    Pack years: 25.00    Types: Cigarettes    Quit date: 01/17/2014    Years since quitting: 6.0  . Smokeless tobacco: Never Used  Substance Use Topics  . Alcohol  use: Yes    Comment: Socially   . Drug use: No    Home Medications Prior to Admission medications   Medication Sig Start Date End Date Taking? Authorizing Provider  albuterol (PROVENTIL HFA;VENTOLIN HFA) 108 (90 BASE) MCG/ACT inhaler Inhale 2 puffs into the lungs every 6 (six) hours as needed for wheezing or shortness of breath.    [provider]  amLODipine (NORVASC) 10 MG tablet Take 10 mg by mouth daily. 01/23/20   [provider]  Biotin 5000 MCG CAPS Take 5,000 mcg by mouth in the morning and at bedtime.     [provider]  busPIRone (BUSPAR) 7.5 MG tablet Take 7.5 mg by mouth 2 (two) times daily.     [provider]  gabapentin (NEURONTIN) 600 MG tablet Take 600 mg by mouth in the morning, at noon, and at bedtime. 05/02/06   [provider]  loperamide (IMODIUM A-D) 2 MG tablet Take 1 tablet (2 mg total) by mouth 4 (four) times daily as needed for diarrhea or loose stools. 01/30/20   Rodolph Bong, MD  meclizine (ANTIVERT) 25 MG tablet Take 1 tablet (25 mg total) by mouth 3 (three) times daily as needed for dizziness. 01/30/20   Rodolph Bong, MD  modafinil (PROVIGIL) 200 MG tablet Take 200  mg by mouth daily.  12/26/19   [provider]  mupirocin ointment (BACTROBAN) 2 % Apply 1 application topically 3 (three) times daily. Apply to scalp 02/04/20   Kalman Shan Ratliff, DO  omeprazole (PRILOSEC) 20 MG capsule Take 20 mg by mouth in the morning and at bedtime.     [provider]  OXcarbazepine (TRILEPTAL) 150 MG tablet Take 1 tablet (150 mg total) by mouth at bedtime. 01/30/20   Eugenie Filler, MD  Oxcarbazepine (TRILEPTAL) 300 MG tablet Take 1 tablet (300 mg total) by mouth daily. 01/31/20   Eugenie Filler, MD  QUEtiapine (SEROQUEL XR) 400 MG 24 hr tablet Take 400 mg by mouth daily. 01/23/20   [provider]  RESTASIS 0.05 % ophthalmic emulsion Place 1 drop into both eyes 2 (two) times daily. 11/11/19    [provider]  sodium chloride 1 g tablet Take 1 g by mouth 2 (two) times daily. 01/23/20   [provider]  Teriflunomide (AUBAGIO) 14 MG TABS Take 14 mg by mouth daily.     [provider]  tolterodine (DETROL LA) 4 MG 24 hr capsule Take 4 mg by mouth daily.    [provider]  venlafaxine XR (EFFEXOR-XR) 75 MG 24 hr capsule Take 75 mg by mouth daily. 01/23/20   [provider]    Allergies    Patient has no known allergies.  Review of Systems   Review of Systems  Constitutional: Negative for fever.  Respiratory: Negative for shortness of breath.   Cardiovascular: Negative for chest pain.  Gastrointestinal: Negative for diarrhea and vomiting.  Neurological: Positive for headaches.  All other systems reviewed and are negative.   Physical Exam Updated Vital Signs BP 122/90 (BP Location: Right Arm)   Pulse 70   Temp (!) 97.5 F (36.4 C) (Oral)   Resp 18   SpO2 99%   Physical Exam CONSTITUTIONAL: Elderly, no acute distress HEAD: Normocephalic/atraumatic, patient wearing a head bandage EYES: EOMI/PERRL ENMT: Mucous membranes moist NECK: supple no meningeal signs SPINE/BACK:entire spine nontender CV: S1/S2 noted, no murmurs/rubs/gallops noted LUNGS: Lungs are clear to auscultation bilaterally, no apparent distress ABDOMEN: soft, nontender NEURO: Pt is awake/alert/appropriate, moves all extremitiesx4.  No facial droop.  No focal weakness is noted.  Patient does not appear confused EXTREMITIES: pulses normal/equal, full ROM SKIN: warm, color normal PSYCH: no abnormalities of mood noted, alert and oriented to situation  ED Results / Procedures / Treatments   Labs (all labs ordered are listed, but only abnormal results are displayed) Labs Reviewed  BASIC METABOLIC PANEL - Abnormal; Notable for the following components:      Result Value   Sodium 124 (*)    Chloride 89 (*)    Glucose, Bld 102 (*)    BUN 7 (*)    All other  components within normal limits  CBC - Abnormal; Notable for the following components:   RBC 3.59 (*)    Hemoglobin 11.1 (*)    HCT 32.5 (*)    All other components within normal limits  BASIC METABOLIC PANEL - Abnormal; Notable for the following components:   Sodium 130 (*)    Chloride 93 (*)    BUN 7 (*)    All other components within normal limits  CBG MONITORING, ED    EKG None  Radiology No results found.  Procedures Procedures (including critical care time)  Medications Ordered in ED Medications  sodium chloride flush (NS) 0.9 % injection 3 mL (  has no administration in time range)    ED Course  I have reviewed the triage vital signs and the nursing notes.  Pertinent labs results that were available during my care of the patient were reviewed by me and considered in my medical decision making (see chart for details).    MDM Rules/Calculators/A&P                      Patient presents to the ER for hyponatremia.  Patient has been seen for this previously and recently admitted.  On previous admission patient was encephalopathic and her hyponatremia was thought to be due to drinking excessive amounts of fluids.  Since that admission her family has done a good job of limiting her fluid intake.  I spoke to her daughter Baxter Hire who is an ER physician in [place].  She was concerned the patient did appear to be getting confused and had difficulty with word finding Patient has been in the ER for several hours.  Other than drinking [facility], no other fluid intake.  Initial sodium was 124, but repeat sodium was 130.  Patient is awake and alert.  She does not appear encephalopathic  Discussed with daughter and patient's son.  Sodium is now improved.  Patient is not encephalopathic.  Patient will be discharged and was encouraged to continue fluid restrictions.  I did inform them that this could be medication related including due to her Trileptal.  This will need to be examined as  an outpatient.  Orders have been placed for PCP referral.  Patient will follow up in urgent care in 24 hours for recheck of the sodium  Final Clinical Impression(s) / ED Diagnoses Final diagnoses:  Hyponatremia    Rx / DC Orders ED Discharge Orders    None       Zadie Rhine, MD 02/13/20 [phone]

## 2020-02-13 NOTE — ED Notes (Signed)
Brought pt back to triage for reassessment pt is alert/oriented x 4, does c/o headache. Pt said she did not miss her evening dose of her seizure meds (pt daughter was concerned she may have missed her dose).

## 2020-02-13 NOTE — ED Notes (Signed)
Please call daughter [name] @ [phone] states her mother is getting more and more confused and she is requesting a call back asap--[name]

## 2020-02-13 NOTE — Discharge Instructions (Addendum)
You should be receiving a phone call to establish a PCP Please follow the fluid intake guidelines to help prevent low sodium You can follow-up at the urgent care on [address] tomorrow for recheck of your sodium

## 2020-02-15 ENCOUNTER — Telehealth: Payer: Self-pay

## 2020-02-15 NOTE — Telephone Encounter (Signed)
Called [employer], patients son, gave Probation officer numbers for t PCP [facility] [employer]

## 2020-02-17 ENCOUNTER — Encounter (HOSPITAL_COMMUNITY): Payer: Self-pay

## 2020-02-17 ENCOUNTER — Emergency Department (HOSPITAL_COMMUNITY): Payer: Medicare (Managed Care)

## 2020-02-17 ENCOUNTER — Other Ambulatory Visit: Payer: Self-pay

## 2020-02-17 ENCOUNTER — Emergency Department (HOSPITAL_COMMUNITY)
Admission: EM | Admit: 2020-02-17 | Discharge: 2020-02-17 | Disposition: A | Discharge status: Home / Self Care | Payer: Medicare (Managed Care) | Attending: Emergency Medicine | Admitting: Emergency Medicine

## 2020-02-17 DIAGNOSIS — Z87891 Personal history of nicotine dependence: Secondary | ICD-10-CM | POA: Insufficient documentation

## 2020-02-17 DIAGNOSIS — G35 Multiple sclerosis: Secondary | ICD-10-CM | POA: Insufficient documentation

## 2020-02-17 DIAGNOSIS — R519 Headache, unspecified: Secondary | ICD-10-CM

## 2020-02-17 DIAGNOSIS — Z79899 Other long term (current) drug therapy: Secondary | ICD-10-CM | POA: Diagnosis not present

## 2020-02-17 DIAGNOSIS — I10 Essential (primary) hypertension: Secondary | ICD-10-CM | POA: Diagnosis not present

## 2020-02-17 DIAGNOSIS — R42 Dizziness and giddiness: Secondary | ICD-10-CM

## 2020-02-17 LAB — CBC
HCT: 38.1 % (ref 36.0–46.0)
Hemoglobin: 12.9 g/dL (ref 12.0–15.0)
MCH: 31.5 pg (ref 26.0–34.0)
MCHC: 33.9 g/dL (ref 30.0–36.0)
MCV: 92.9 fL (ref 80.0–100.0)
Platelets: 422 10*3/uL — ABNORMAL HIGH (ref 150–400)
RBC: 4.1 MIL/uL (ref 3.87–5.11)
RDW: 12.8 % (ref 11.5–15.5)
WBC: 6.8 10*3/uL (ref 4.0–10.5)
nRBC: 0 % (ref 0.0–0.2)

## 2020-02-17 LAB — COMPREHENSIVE METABOLIC PANEL
ALT: 18 U/L (ref 0–44)
AST: 23 U/L (ref 15–41)
Albumin: 4.2 g/dL (ref 3.5–5.0)
Alkaline Phosphatase: 87 U/L (ref 38–126)
Anion gap: 10 (ref 5–15)
BUN: 9 mg/dL (ref 8–23)
CO2: 26 mmol/L (ref 22–32)
Calcium: 10.2 mg/dL (ref 8.9–10.3)
Chloride: 95 mmol/L — ABNORMAL LOW (ref 98–111)
Creatinine, Ser: 0.59 mg/dL (ref 0.44–1.00)
GFR calc Af Amer: >60 mL/min (ref >60)
GFR calc non Af Amer: >60 mL/min (ref >60)
Glucose, Bld: 100 mg/dL — ABNORMAL HIGH (ref 70–99)
Potassium: 4.3 mmol/L (ref 3.5–5.1)
Sodium: 131 mmol/L — ABNORMAL LOW (ref 135–145)
Total Bilirubin: 0.5 mg/dL (ref 0.3–1.2)
Total Protein: 7.2 g/dL (ref 6.5–8.1)

## 2020-02-17 MED ORDER — OXYCODONE-ACETAMINOPHEN 5-325 MG PO TABS
1.0000 | ORAL_TABLET | Freq: Once | ORAL | Status: AC
  Administered 2020-02-17: 1 via ORAL
  Filled 2020-02-17: qty 1

## 2020-02-17 MED ORDER — MECLIZINE HCL 12.5 MG PO TABS: 12.5000 mg | ORAL_TABLET | Freq: Three times a day (TID) | ORAL | 0 refills | Status: AC | PRN

## 2020-02-17 MED ORDER — GADOBUTROL 1 MMOL/ML IV SOLN
5.0000 mL | Freq: Once | INTRAVENOUS | Status: AC | PRN
  Administered 2020-02-17: 5 mL via INTRAVENOUS

## 2020-02-17 NOTE — ED Triage Notes (Signed)
Patient complains of ongoing dizziness and lightheadedness since last night. States that she knows it is her low sodium again. Has had same 3 times in 3 weeks with required admission. Complains of intermittent headache with same. Has taken sodium tablets as prescribed

## 2020-02-17 NOTE — Discharge Instructions (Addendum)
You are given prescription for your meclizine. You are given a Percocet here and should continue your ibuprofen and acetaminophen with plenty of fluids as an outpatient Recheck with your rehab doctor tomorrow regarding your vertigo and headaches Please walk at home only with your walker.

## 2020-02-17 NOTE — ED Provider Notes (Addendum)
[name] Provider Note   CSN: [id number] Arrival date & time: 02/17/20  7062     History No chief complaint on file.   Tara Wade is a 69 y.o. female.  HPI    69 yo female with multiple episodes of hyponatremia, presents today complainingof lightheadedness, dizzy, and headache which has been present since seizure with hyponatremia due to hitting back of head 3 weeks.  Patient taking medicine for dizziness and abx for wound on head.  HA and dizziness worsened last night.  No new trauma, no seizure.  Patient reports history 2 seizures during past 3 weeks.  She has been on sz medicine since 5 years ago.  Past Medical History:  Diagnosis Date  . Anxiety   . Hypertension   . Multiple sclerosis ([place])   . Seizures [facility])     Patient Active Problem List   Diagnosis Date Noted  . Acute encephalopathy 02/05/2020  . Hyponatremia 02/05/2020  . BPPV (benign paroxysmal positional vertigo), left 01/30/2020  . Hypokalemia   . Hypomagnesemia   . Dizziness   . Nonintractable headache   . Seizure ([place]) 01/28/2020  . Electrolyte abnormality 01/28/2020  . Multiple sclerosis ([place]) 01/28/2020  . Essential hypertension 01/28/2020  . Hematoma of scalp     Past Surgical History:  Procedure Laterality Date  . ABDOMINAL HYSTERECTOMY    . EYE SURGERY    . KNEE SURGERY Left      OB History   No obstetric history on file.     Family History  Problem Relation Age of Onset  . Multiple sclerosis Brother   . Seizures Nephew     Social History   Tobacco Use  . Smoking status: Former Smoker    Packs/day: 1.00    Years: 25.00    Pack years: 25.00    Types: Cigarettes    Quit date: 01/17/2014    Years since quitting: 6.0  . Smokeless tobacco: Never Used  Substance Use Topics  . Alcohol use: Yes    Comment: Socially   . Drug use: No    Home Medications Prior to Admission medications   Medication Sig Start Date End Date Taking?  Authorizing Provider  albuterol (PROVENTIL HFA;VENTOLIN HFA) 108 (90 BASE) MCG/ACT inhaler Inhale 2 puffs into the lungs every 6 (six) hours as needed for wheezing or shortness of breath.    [provider]  amLODipine (NORVASC) 10 MG tablet Take 10 mg by mouth daily. 01/23/20   [provider]  Biotin 5000 MCG CAPS Take 5,000 mcg by mouth in the morning and at bedtime.     [provider]  busPIRone (BUSPAR) 7.5 MG tablet Take 7.5 mg by mouth 2 (two) times daily.     [provider]  gabapentin (NEURONTIN) 600 MG tablet Take 600 mg by mouth in the morning, at noon, and at bedtime. 05/02/06   [provider]  loperamide (IMODIUM A-D) 2 MG tablet Take 1 tablet (2 mg total) by mouth 4 (four) times daily as needed for diarrhea or loose stools. 01/30/20   Eugenie Filler, MD  meclizine (ANTIVERT) 25 MG tablet Take 1 tablet (25 mg total) by mouth 3 (three) times daily as needed for dizziness. 01/30/20   Eugenie Filler, MD  modafinil (PROVIGIL) 200 MG tablet Take 200 mg by mouth daily.  12/26/19   [provider]  mupirocin ointment (BACTROBAN) 2 % Apply 1 application topically 3 (three) times daily. Apply to  scalp 02/04/20   Geralyn Corwin Ratliff, DO  omeprazole (PRILOSEC) 20 MG capsule Take 20 mg by mouth in the morning and at bedtime.     [provider]  OXcarbazepine (TRILEPTAL) 150 MG tablet Take 1 tablet (150 mg total) by mouth at bedtime. 01/30/20   Rodolph Bong, MD  Oxcarbazepine (TRILEPTAL) 300 MG tablet Take 1 tablet (300 mg total) by mouth daily. 01/31/20   Rodolph Bong, MD  QUEtiapine (SEROQUEL XR) 400 MG 24 hr tablet Take 400 mg by mouth daily. 01/23/20   [provider]  RESTASIS 0.05 % ophthalmic emulsion Place 1 drop into both eyes 2 (two) times daily. 11/11/19   [provider]  sodium chloride 1 g tablet Take 1 g by mouth 2 (two) times daily. 01/23/20   [provider]  Teriflunomide  (AUBAGIO) 14 MG TABS Take 14 mg by mouth daily.     [provider]  tolterodine (DETROL LA) 4 MG 24 hr capsule Take 4 mg by mouth daily.    [provider]  venlafaxine XR (EFFEXOR-XR) 75 MG 24 hr capsule Take 75 mg by mouth daily. 01/23/20   [provider]    Allergies    Patient has no known allergies.  Review of Systems   Review of Systems  Physical Exam Updated Vital Signs BP 124/87   Pulse 81   Temp 97.9 F (36.6 C) (Oral)   Resp 16   Ht 1.6 m (5\' 3" )   Wt 49.9 kg   SpO2 100%   BMI 19.49 kg/m   Physical Exam Vitals and nursing note reviewed.  Constitutional:      General: She is not in acute distress.    Appearance: Normal appearance. She is normal weight.  HENT:     Head: Normocephalic.     Comments: Healing wound on occiput    Right Ear: External ear normal.     Left Ear: External ear normal.     Nose: Nose normal.     Mouth/Throat:     Mouth: Mucous membranes are moist.  Eyes:     Extraocular Movements: Extraocular movements intact.     Pupils: Pupils are equal, round, and reactive to light.  Cardiovascular:     Rate and Rhythm: Normal rate.     Pulses: Normal pulses.  Pulmonary:     Effort: Pulmonary effort is normal.  Abdominal:     General: Abdomen is flat.     Palpations: Abdomen is soft.  Musculoskeletal:        General: Normal range of motion.     Cervical back: Normal range of motion.  Skin:    General: Skin is warm.     Capillary Refill: Capillary refill takes less than 2 seconds.  Neurological:     Mental Status: She is alert. Mental status is at baseline.  Psychiatric:        Mood and Affect: Mood normal.     ED Results / Procedures / Treatments   Labs (all labs ordered are listed, but only abnormal results are displayed) Labs Reviewed - No data to display  EKG None  ED ECG REPORT   Date: 02/17/2020  Rate: 83  Rhythm: normal sinus rhythm  QRS Axis: normal  Intervals: normal  ST/T Wave  abnormalities: nonspecific ST/T changes  Conduction Disutrbances:none  Narrative Interpretation:   Old EKG Reviewed: none available  I have personally reviewed the EKG tracing and agree with the computerized printout as noted.  Radiology CT Head Wo Contrast  Result Date: 02/17/2020 CLINICAL DATA:  Headache, dizziness, lightheadedness. No reported injury. EXAM: CT HEAD WITHOUT CONTRAST TECHNIQUE: Contiguous axial images were obtained from the base of the skull through the vertex without intravenous contrast. COMPARISON:  02/04/2020 head CT. FINDINGS: Brain: No evidence of parenchymal hemorrhage or extra-axial fluid collection. No mass lesion, mass effect, or midline shift. No CT evidence of acute infarction. Nonspecific moderate subcortical and periventricular white matter hypodensity, most in keeping with chronic small vessel ischemic change. Cerebral volume is age appropriate. No ventriculomegaly. Vascular: No acute abnormality. Skull: No evidence of calvarial fracture. Sinuses/Orbits: The visualized paranasal sinuses are essentially clear. Other:  The mastoid air cells are unopacified. IMPRESSION: 1. No evidence of acute intracranial abnormality. 2. 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 Electronically Signed   By: Delbert Phenix M.D.   On: 02/17/2020 10:41    Procedures Procedures (including critical care time)  Medications Ordered in ED Medications - No data to display  ED Course  I have reviewed the triage vital signs and the nursing notes.  Pertinent labs & imaging results that were available during my care of the patient were reviewed by me and considered in my medical decision making (see chart for details).  Clinical Course as of Feb 17 1335  Mon Feb 17, 2020  1335 Reviewed labs.  Sodium 131Reviewed CTReviewed above with patient and discussed MRI due to patient's vertigo symptoms.   [DR]    Clinical Course User Index [DR] Margarita Grizzle, MD     MDM Rules/Calculators/A&P                      Weakness, dizziness, and vertigo Patient with chronic hyponatremia.  Today sodium is 131 and doubt this is the cause of her vertigo.  Head CT shows no evidence of acute abnormality.  Plan MRI.  Plan ambulation if this is negative and likely discharge home 1- vertigo-discussed safe ambulation and patient states she has a walker.  She is encouraged to ambulate only with walker. She requests refill of her meclizine. She has follow-up scheduled with rehab as an outpatient tomorrow 2 headaches patient requests medication for her headaches.  She states she has been taking ibuprofen alternating with Tylenol without relief Will give 1 dose of Percocet here.  Patient has follow-up tomorrow outpatient with rehab and should continue her Tylenol and ibuprofen in interim Final Clinical Impression(s) / ED Diagnoses Final diagnoses:  Vertigo  Nonintractable headache, unspecified chronicity pattern, unspecified headache type    Rx / DC Orders ED Discharge Orders    None       Margarita Grizzle, MD 02/17/20 1505    Margarita Grizzle, MD 02/17/20 [phone]

## 2020-02-18 ENCOUNTER — Ambulatory Visit: Payer: Medicare (Managed Care) | Attending: Orthopedic Surgery | Admitting: Physical Therapy

## 2020-02-18 VITALS — BP 117/75 | HR 95

## 2020-02-18 DIAGNOSIS — R2681 Unsteadiness on feet: Secondary | ICD-10-CM

## 2020-02-18 DIAGNOSIS — R42 Dizziness and giddiness: Secondary | ICD-10-CM | POA: Insufficient documentation

## 2020-02-18 DIAGNOSIS — H8113 Benign paroxysmal vertigo, bilateral: Secondary | ICD-10-CM | POA: Insufficient documentation

## 2020-02-19 ENCOUNTER — Encounter: Payer: Self-pay | Admitting: Physical Therapy

## 2020-02-19 NOTE — Therapy (Signed)
[facility] [address] [place], [place], [address] Phone: [phone]   Fax:  [phone]  Physical Therapy Treatment  Patient Details  Name: Tara Wade MRN: [id number] Date of Birth: 1950-12-13 Referring Provider (PT): Ramiro Harvest, MD   Encounter Date: 02/18/2020  PT End of Session - 02/19/20 1358    Visit Number  3    Number of Visits  4    Date for PT Re-Evaluation  03/06/20    Authorization Type  Generic Medicare Advantage    Authorization Time Period  02-04-20 - 04-05-20    PT Start Time  1018    PT Stop Time  1100    PT Time Calculation (min)  42 min    Activity Tolerance  Patient tolerated treatment well    Behavior During Therapy  [facility] for tasks assessed/performed       Past Medical History:  Diagnosis Date  . Anxiety   . Hypertension   . Multiple sclerosis (HCC)   . Seizures (HCC)     Past Surgical History:  Procedure Laterality Date  . ABDOMINAL HYSTERECTOMY    . EYE SURGERY    . KNEE SURGERY Left     Vitals:   02/18/20 1035  BP: 117/75  Pulse: 95    Subjective Assessment - 02/19/20 1347    Subjective  Pt accompanied to PT by her son, Tara Wade - states pt had an episode of dizziness on Sunday night - states down on floor at home because she was so dizzy and then went to bed; went to ED on Monday    Patient is accompained by:  Family member   son, Tara Wade   Pertinent History  seizure with fall on 01-28-20 - hit her head    Patient Stated Goals  improve balance and decrease the dizziness    Currently in Pain?  No/denies    Pain Onset  1 to 4 weeks ago              Vestibular Assessment - 02/19/20 0001      Positional Testing   Sidelying Test  Sidelying Left      Dix-Hallpike Right   Dix-Hallpike Right Duration  none    Dix-Hallpike Right Symptoms  No nystagmus      Dix-Hallpike Left   Dix-Hallpike Left Duration  none    Dix-Hallpike Left Symptoms  No nystagmus       Sidelying Right   Sidelying Right Duration  none    Sidelying Right Symptoms  No nystagmus      Sidelying Left   Sidelying Left Duration  none    Sidelying Left Symptoms  No nystagmus      Positional Sensitivities   Up from Right Hallpike  Lightheadedness    Up from Left Hallpike  Lightheadedness      Orthostatics   BP supine (x 5 minutes)  131/78    HR supine (x 5 minutes)  84    BP sitting  124/84    HR sitting  92    BP standing (after 1 minute)  138/91    HR standing (after 1 minute)  99    BP standing (after 3 minutes)  129/102    HR standing (after 3 minutes)  102                Vestibular Treatment/Exercise - 02/19/20 0001      Vestibular Treatment/Exercise   Gaze Exercises  X1 Viewing Horizontal;X1 Viewing Vertical  DVA line 3;  SVA line 8 (5 line difference - abnormal as > 2 line difference)     PT Education - 02/19/20 1357    Education Details  recommended pt to perform x1 viewing in standing at least 3x times/day; also instructed in standing on floor/pillows with EO and EC for HEP - son was instructed in how to perform these exercises correctly with pt    Person(s) Educated  Patient;Child(ren)    Methods  Explanation;Demonstration;Handout    Comprehension  Verbalized understanding;Returned demonstration       PT Short Term Goals - 02/19/20 1402      PT SHORT TERM GOAL #1   Title  same as LTG's        PT Long Term Goals - 02/19/20 1402      PT LONG TERM GOAL #1   Title  Pt will have (-) Lt Dix-Hallpike test to indicate resolution of Lt BPPV.    Time  2    Period  Weeks    Status  New      PT LONG TERM GOAL #2   Title  Independent in HEP for balance and vestibular exercises.    Time  2    Period  Weeks    Status  New            Plan - 02/19/20 1359    Clinical Impression Statement  Pt had no signs or symptoms consistent with BPPV at today's session as no nystagmus was noted with any positional testing.  BP readings do  not indicate orthostatic hypotension - etiology of pt's dizziness remains unknown at this time.    Personal Factors and Comorbidities  Past/Current Experience;Transportation;Comorbidity 2    Comorbidities  MS, seizures, hypokalemia, electrolyte abnormality    Examination-Activity Limitations  Locomotion Level;Transfers;Bend;Squat;Lift;Reach Overhead    Examination-Participation Restrictions  Meal Prep;Cleaning;Community Activity;Interpersonal Relationship;Shop;Laundry    Stability/Clinical Decision Making  Evolving/Moderate complexity    Rehab Potential  Good    PT Frequency  2x / week    PT Duration  2 weeks   pt states she plans on returning to [place] on 02-18-20   PT Treatment/Interventions  Canalith Repostioning;ADLs/Self Care Home Management;Balance training;Therapeutic exercise;Therapeutic activities;Neuromuscular re-education;Vestibular;Gait training;Stair training;Patient/family education    PT Next Visit Plan  recheck x1 viewing and balance on foam exs. given for HEP on 02-18-20    PT Home Exercise Plan  x1 viewing and balance on foam    Consulted and Agree with Plan of Care  Patient       Patient will benefit from skilled therapeutic intervention in order to improve the following deficits and impairments:  Dizziness, Decreased balance, Difficulty walking  Visit Diagnosis: BPPV (benign paroxysmal positional vertigo), bilateral  Dizziness and giddiness  Unsteadiness on feet     Problem List Patient Active Problem List   Diagnosis Date Noted  . Acute encephalopathy 02/05/2020  . Hyponatremia 02/05/2020  . BPPV (benign paroxysmal positional vertigo), left 01/30/2020  . Hypokalemia   . Hypomagnesemia   . Dizziness   . Nonintractable headache   . Seizure ([place]) 01/28/2020  . Electrolyte abnormality 01/28/2020  . Multiple sclerosis ([place]) 01/28/2020  . Essential hypertension 01/28/2020  . Hematoma of scalp     [name], [place], PT 02/19/2020, 2:04 PM  [facility] [address] [place], [place], [address] Phone: [phone]   Fax:  [phone]  Name: Tara Wade MRN: [id number] Date of Birth: October 06, 1950

## 2020-02-25 ENCOUNTER — Other Ambulatory Visit: Payer: Self-pay

## 2020-02-25 ENCOUNTER — Ambulatory Visit: Payer: Medicare (Managed Care) | Admitting: Physical Therapy

## 2020-02-25 DIAGNOSIS — R2681 Unsteadiness on feet: Secondary | ICD-10-CM

## 2020-02-25 DIAGNOSIS — R42 Dizziness and giddiness: Secondary | ICD-10-CM

## 2020-02-25 DIAGNOSIS — H8113 Benign paroxysmal vertigo, bilateral: Secondary | ICD-10-CM | POA: Diagnosis not present

## 2020-02-25 NOTE — Patient Instructions (Signed)
Standing Marching   Using a chair if necessary, march in place. Repeat 10 times. Do 1 sessions per day.  Add head turns side to side for more challenge.  http://[email or web address].exer.us/344   Hip Backward Kick   Using a chair for balance, keep legs shoulder width apart and toes pointed for- ward. Slowly extend one leg back, keeping knee straight. Do not lean forward. Repeat with other leg. Repeat 10 times. Do 1 sessions per day.  http://[email or web address].exer.us/340     Hip Side Kick   Holding a chair for balance, keep legs shoulder width apart and toes pointed forward. Swing a leg out to side, keeping knee straight. Do not lean. Repeat using other leg. Repeat 10 times. Do 1 sessions per day.  http://[email or web address].exer.us/342   ALSO DO FORWARD KICKS  Feet Heel-Toe "Tandem", Varied Arm Positions - Eyes Open -PARTIAL HEEL TO TOE   With eyes open, right foot directly in front of the other, arms out, look straight ahead at a stationary object. Hold 30 seconds. Repeat 1 times per session. Do 1 sessions per day.      Standing On One Leg Without Support .  Stand on one leg in neutral spine without support. Hold 10 seconds. Repeat on other leg. Do 1-2 repetitions, 1  sets.  http://bt.exer.us/36   Copyright  VHI. All rights reserved.         Braiding   Move to side: 1) cross right leg in front of left, 2) bring back leg out to side, then 3) cross right leg behind left, 4) bring left leg out to side. Continue sequence in same direction. Reverse sequence, moving in opposite direction. Repeat sequence 1 times per session. Do 1 sessions per day.

## 2020-02-25 NOTE — Therapy (Signed)
[facility] [address] [place], [place], [address] Phone: [phone]   Fax:  [phone]  Physical Therapy Treatment  Patient Details  Name: Tara Wade MRN: [id number] Date of Birth: [birth year] Referring Provider (PT): Irine Seal, MD   Encounter Date: 02/25/2020  PT End of Session - 02/26/20 2152    Visit Number  4    Number of Visits  4    Date for PT Re-Evaluation  03/06/20    Authorization Type  Generic Medicare Advantage    Authorization Time Period  02-04-20 - 04-05-20    PT Start Time  1532    PT Stop Time  1610    PT Time Calculation (min)  38 min    Activity Tolerance  Patient tolerated treatment well    Behavior During Therapy  [facility] for tasks assessed/performed       Past Medical History:  Diagnosis Date  . Anxiety   . Hypertension   . Multiple sclerosis ([place])   . Seizures ([place])     Past Surgical History:  Procedure Laterality Date  . ABDOMINAL HYSTERECTOMY    . EYE SURGERY    . KNEE SURGERY Left     There were no vitals filed for this visit.  Subjective Assessment - 02/25/20 1539    Subjective  Pt reports she still has headaches that occur daily and still has dizziness that lasts minutes to hours; no specific positions make it worse    Patient is accompained by:  Family member   son, Delfino Lovett   Pertinent History  seizure with fall on 01-28-20 - hit her head    Patient Stated Goals  improve balance and decrease the dizziness    Currently in Pain?  No/denies   No headache at this time   Pain Onset  1 to 4 weeks ago       Romberg EO 30 secs:  EC 30 secs Sharpened Romberg EO 14.00     Pt instructed in balance HEP - see pt instructions for details; pt instructed to perform exs at counter for UE support prn for safety                    PT Education - 02/26/20 2151    Education Details  Balance HEP issued    Person(s) Educated  Patient    Methods   Explanation;Demonstration;Handout    Comprehension  Verbalized understanding;Returned demonstration       PT Short Term Goals - 02/26/20 2153      PT SHORT TERM GOAL #1   Title  same as LTG's        PT Long Term Goals - 02/26/20 2153      PT LONG TERM GOAL #1   Title  Pt will have (-) Lt Dix-Hallpike test to indicate resolution of Lt BPPV.    Baseline  met 02-25-20    Time  2    Period  Weeks    Status  Achieved      PT LONG TERM GOAL #2   Title  Independent in HEP for balance and vestibular exercises.    Baseline  met 02-25-20    Time  2    Period  Weeks    Status  Achieved            Plan - 02/25/20 1537    Clinical Impression Statement  Pt has met 2/2 LTG's with no positional vertigo present at this time.  Pt  does continue to have decreased high level balance skills and c/o headaches (frequently since fall in mid May 2021).    Personal Factors and Comorbidities  Past/Current Experience;Transportation;Comorbidity 2    Comorbidities  MS, seizures, hypokalemia, electrolyte abnormality    Examination-Activity Limitations  Locomotion Level;Transfers;Bend;Squat;Lift;Reach Overhead    Examination-Participation Restrictions  Meal Prep;Cleaning;Community Activity;Interpersonal Relationship;Shop;Laundry    Stability/Clinical Decision Making  Evolving/Moderate complexity    Rehab Potential  Good    PT Frequency  2x / week    PT Duration  2 weeks   pt states she plans on returning to [place] on 02-18-20   PT Treatment/Interventions  Canalith Repostioning;ADLs/Self Care Home Management;Balance training;Therapeutic exercise;Therapeutic activities;Neuromuscular re-education;Vestibular;Gait training;Stair training;Patient/family education    PT Next Visit Plan  recheck x1 viewing and balance on foam exs. given for HEP on 02-18-20    PT Home Exercise Plan  x1 viewing and balance on foam    Consulted and Agree with Plan of Care  Patient       Patient will benefit from skilled  therapeutic intervention in order to improve the following deficits and impairments:  Dizziness, Decreased balance, Difficulty walking  Visit Diagnosis: Dizziness and giddiness  Unsteadiness on feet     Problem List Patient Active Problem List   Diagnosis Date Noted  . Acute encephalopathy 02/05/2020  . Hyponatremia 02/05/2020  . BPPV (benign paroxysmal positional vertigo), left 01/30/2020  . Hypokalemia   . Hypomagnesemia   . Dizziness   . Nonintractable headache   . Seizure ([place]) 01/28/2020  . Electrolyte abnormality 01/28/2020  . Multiple sclerosis ([place]) 01/28/2020  . Essential hypertension 01/28/2020  . Hematoma of scalp     PHYSICAL THERAPY DISCHARGE SUMMARY  Visits from Start of Care: 4  Current functional level related to goals / functional outcomes: See above - no positional vertigo at this time - c/o dizziness of central origin   Remaining deficits: Pt reports dizziness with quick turns and with return to upright from supine or from sidelying positions Decr. High level balance skills   Education / Equipment: Pt has been instructed in x1 viewing ex. For improved gaze stabilization (VOR) and also instructed in balance HEP Plan: Patient agrees to discharge.  Patient goals were met. Patient is being discharged due to meeting the stated rehab goals.  ?????         Alda Lea, PT 02/26/2020, 9:57 PM  [facility] [address] [place], [place], [address] Phone: [phone]   Fax:  [phone]  Name: Sativa Gelles MRN: [id number] Date of Birth: Sep 18, 1951

## 2020-02-26 ENCOUNTER — Encounter: Payer: Self-pay | Admitting: Physical Therapy

## 2021-03-31 ENCOUNTER — Encounter (HOSPITAL_BASED_OUTPATIENT_CLINIC_OR_DEPARTMENT_OTHER): Payer: Self-pay | Admitting: Obstetrics and Gynecology

## 2021-03-31 ENCOUNTER — Emergency Department (HOSPITAL_BASED_OUTPATIENT_CLINIC_OR_DEPARTMENT_OTHER)
Admission: EM | Admit: 2021-03-31 | Discharge: 2021-04-01 | Disposition: A | Discharge status: Home / Self Care | Payer: Medicare (Managed Care) | Attending: Emergency Medicine | Admitting: Emergency Medicine

## 2021-03-31 ENCOUNTER — Other Ambulatory Visit: Payer: Self-pay

## 2021-03-31 DIAGNOSIS — B9689 Other specified bacterial agents as the cause of diseases classified elsewhere: Secondary | ICD-10-CM | POA: Insufficient documentation

## 2021-03-31 DIAGNOSIS — N390 Urinary tract infection, site not specified: Secondary | ICD-10-CM | POA: Insufficient documentation

## 2021-03-31 DIAGNOSIS — I10 Essential (primary) hypertension: Secondary | ICD-10-CM | POA: Diagnosis not present

## 2021-03-31 DIAGNOSIS — G40909 Epilepsy, unspecified, not intractable, without status epilepticus: Secondary | ICD-10-CM | POA: Insufficient documentation

## 2021-03-31 DIAGNOSIS — R569 Unspecified convulsions: Secondary | ICD-10-CM

## 2021-03-31 DIAGNOSIS — E876 Hypokalemia: Secondary | ICD-10-CM | POA: Diagnosis not present

## 2021-03-31 DIAGNOSIS — Z79899 Other long term (current) drug therapy: Secondary | ICD-10-CM | POA: Insufficient documentation

## 2021-03-31 DIAGNOSIS — Z87891 Personal history of nicotine dependence: Secondary | ICD-10-CM | POA: Insufficient documentation

## 2021-03-31 LAB — CBC WITH DIFFERENTIAL/PLATELET
Abs Immature Granulocytes: 0.04 10*3/uL (ref 0.00–0.07)
Basophils Absolute: 0.1 10*3/uL (ref 0.0–0.1)
Basophils Relative: 1 %
Eosinophils Absolute: 0.1 10*3/uL (ref 0.0–0.5)
Eosinophils Relative: 1 %
HCT: 33.7 % — ABNORMAL LOW (ref 36.0–46.0)
Hemoglobin: 11.2 g/dL — ABNORMAL LOW (ref 12.0–15.0)
Immature Granulocytes: 0 %
Lymphocytes Relative: 13 %
Lymphs Abs: 1.2 10*3/uL (ref 0.7–4.0)
MCH: 29.6 pg (ref 26.0–34.0)
MCHC: 33.2 g/dL (ref 30.0–36.0)
MCV: 88.9 fL (ref 80.0–100.0)
Monocytes Absolute: 0.6 10*3/uL (ref 0.1–1.0)
Monocytes Relative: 7 %
Neutro Abs: 7.3 10*3/uL (ref 1.7–7.7)
Neutrophils Relative %: 78 %
Platelets: 337 10*3/uL (ref 150–400)
RBC: 3.79 MIL/uL — ABNORMAL LOW (ref 3.87–5.11)
RDW: 13.7 % (ref 11.5–15.5)
WBC: 9.4 10*3/uL (ref 4.0–10.5)
nRBC: 0 % (ref 0.0–0.2)

## 2021-03-31 LAB — BASIC METABOLIC PANEL
Anion gap: 11 (ref 5–15)
BUN: 12 mg/dL (ref 8–23)
CO2: 25 mmol/L (ref 22–32)
Calcium: 7.6 mg/dL — ABNORMAL LOW (ref 8.9–10.3)
Chloride: 104 mmol/L (ref 98–111)
Creatinine, Ser: 0.58 mg/dL (ref 0.44–1.00)
GFR, Estimated: >60 mL/min (ref >60)
Glucose, Bld: 107 mg/dL — ABNORMAL HIGH (ref 70–99)
Potassium: 2.9 mmol/L — ABNORMAL LOW (ref 3.5–5.1)
Sodium: 140 mmol/L (ref 135–145)

## 2021-03-31 LAB — URINALYSIS, ROUTINE W REFLEX MICROSCOPIC
Bilirubin Urine: NEGATIVE
Glucose, UA: NEGATIVE mg/dL
Hgb urine dipstick: NEGATIVE
Ketones, ur: NEGATIVE mg/dL
Nitrite: NEGATIVE
Protein, ur: NEGATIVE mg/dL
Specific Gravity, Urine: 1.007 (ref 1.005–1.030)
pH: 7 (ref 5.0–8.0)

## 2021-03-31 LAB — CBG MONITORING, ED: Glucose-Capillary: 111 mg/dL — ABNORMAL HIGH (ref 70–99)

## 2021-03-31 MED ORDER — POTASSIUM CHLORIDE CRYS ER 20 MEQ PO TBCR
40.0000 meq | EXTENDED_RELEASE_TABLET | Freq: Once | ORAL | Status: AC
  Administered 2021-04-01: 40 meq via ORAL
  Filled 2021-03-31: qty 2

## 2021-03-31 MED ORDER — FOSFOMYCIN TROMETHAMINE 3 G PO PACK
3.0000 g | PACK | Freq: Once | ORAL | Status: AC
  Administered 2021-04-01: 3 g via ORAL
  Filled 2021-03-31: qty 3

## 2021-03-31 MED ORDER — POTASSIUM CHLORIDE 10 MEQ/100ML IV SOLN
10.0000 meq | INTRAVENOUS | Status: AC
  Administered 2021-04-01 (×2): 10 meq via INTRAVENOUS
  Filled 2021-03-31 (×2): qty 100

## 2021-03-31 MED ORDER — ACETAMINOPHEN 500 MG PO TABS
1000.0000 mg | ORAL_TABLET | Freq: Once | ORAL | Status: DC
  Filled 2021-03-31: qty 2

## 2021-03-31 NOTE — ED Triage Notes (Signed)
Patient reports to the ER for seizures. Patient reports a known hx of seizures. Patient states her head hurts and she feels like there is a knot in her tongue.

## 2021-03-31 NOTE — ED Provider Notes (Signed)
[facility] Provider Note: Tara Dell, MD, FACEP  CSN: [id number] MRN: [id number] ARRIVAL: 03/31/21 at 2102 ROOM: DB015/DB015   CHIEF COMPLAINT  Seizure   HISTORY OF PRESENT ILLNESS  03/31/21 10:50 PM Tara Wade is a 70 y.o. female with a seizure disorder on Keppra.  She has been compliant with her Keppra.  She had a seizure this evening about 7:30 PM lasting about 30 seconds.  It was a generalized tonic-clonic seizure.  She believes she bit her tongue as she feels a "knot" in it.  She also has a postictal headache which she rates as a 10 out of 10.  It is located diffusely.  Since the first seizure she has had since May 2021.  She has not been ill and knows of no other trigger.   Past Medical History:  Diagnosis Date   Anxiety    Hypertension    Multiple sclerosis (HCC)    Seizures (H[facility])     Past Surgical History:  Procedure Laterality Date   ABDOMINAL HYSTERECTOMY     EYE SURGERY     KNEE SURGERY Left     Family History  Problem Relation Age of Onset   Multiple sclerosis Brother    Seizures Nephew     Social History   Tobacco Use   Smoking status: Former    Packs/day: 1.00    Years: 25.00    Pack years: 25.00    Types: Cigarettes    Quit date: 01/17/2014    Years since quitting: 7.2   Smokeless tobacco: Never  Vaping Use   Vaping Use: Never used  Substance Use Topics   Alcohol use: Yes    Comment: Socially    Drug use: No    Prior to Admission medications   Medication Sig Start Date End Date Taking? Authorizing Provider  levETIRAcetam (KEPPRA) 500 MG tablet Take 500 mg by mouth 2 (two) times daily.   Yes [provider]  potassium chloride SA (KLOR-CON) 20 MEQ tablet Take 1 tablet (20 mEq total) by mouth 2 (two) times daily. 04/01/21  Yes Sadhana Frater, MD  albuterol (PROVENTIL HFA;VENTOLIN HFA) 108 (90 BASE) MCG/ACT inhaler Inhale 2 puffs into the lungs every 6 (six) hours as needed for wheezing or shortness of breath.    [provider]  amLODipine (NORVASC) 10 MG tablet Take 10 mg by mouth daily. 01/23/20   [provider]  Biotin 5000 MCG CAPS Take 5,000 mcg by mouth in the morning and at bedtime.     [provider]  busPIRone (BUSPAR) 7.5 MG tablet Take 7.5 mg by mouth 2 (two) times daily.     [provider]  meclizine (ANTIVERT) 12.5 MG tablet Take 1 tablet (12.5 mg total) by mouth 3 (three) times daily as needed for dizziness. 02/17/20   Margarita Grizzle, MD  modafinil (PROVIGIL) 200 MG tablet Take 200 mg by mouth in the morning.  12/26/19   [provider]  mupirocin ointment (BACTROBAN) 2 % Apply 1 application topically 3 (three) times daily. Apply to scalp 02/04/20   Geralyn Corwin Ratliff, DO  omeprazole (PRILOSEC) 20 MG capsule Take 20 mg by mouth 2 (two) times daily.     [provider]  QUEtiapine (SEROQUEL XR) 400 MG 24 hr tablet Take 400 mg by mouth daily at 4 PM.  01/23/20   [provider]  RESTASIS 0.05 % ophthalmic emulsion Place 1 drop into both eyes 2 (two) times daily. 11/11/19   [provider]  sodium chloride 1 g tablet Take 1 g by mouth 2 (two) times daily. 01/23/20   [provider]  Teriflunomide (AUBAGIO) 14 MG TABS Take 14 mg by mouth daily.     [provider]  venlafaxine XR (EFFEXOR-XR) 75 MG 24 hr capsule Take 75 mg by mouth daily. 01/23/20   [provider]    Allergies Patient has no known allergies.   REVIEW OF SYSTEMS  Negative except as noted here or in the History of Present Illness.   PHYSICAL EXAMINATION  Initial Vital Signs Blood pressure 126/66, pulse 72, temperature 98.6 F (37 C), temperature source Oral, resp. rate 15, SpO2 98 %.  Examination General: Well-developed, well-nourished female in no acute distress; appearance consistent with age of record HENT: normocephalic; bite marks to sides of tongue, hemostatic Eyes: pupils equal, round and reactive to light; extraocular muscles  intact Neck: supple Heart: regular rate and rhythm Lungs: clear to auscultation bilaterally Abdomen: soft; nondistended; nontender; bowel sounds present Extremities: No deformity; full range of motion; pulses normal Neurologic: Awake, alert; noted to move all extremities; no facial droop Skin: Warm and dry Psychiatric: Normal mood and affect   RESULTS  Summary of this visit's results, reviewed and interpreted by myself:   EKG Interpretation  Date/Time:    Ventricular Rate:    PR Interval:    QRS Duration:   QT Interval:    QTC Calculation:   R Axis:     Text Interpretation:         Laboratory Studies: Results for orders placed or performed during the hospital encounter of 03/31/21 (from the past 24 hour(s))  CBG monitoring, ED     Status: Abnormal   Collection Time: 03/31/21  9:26 PM  Result Value Ref Range   Glucose-Capillary 111 (H) 70 - 99 mg/dL  Basic metabolic panel     Status: Abnormal   Collection Time: 03/31/21 10:50 PM  Result Value Ref Range   Sodium 140 135 - 145 mmol/L   Potassium 2.9 (L) 3.5 - 5.1 mmol/L   Chloride 104 98 - 111 mmol/L   CO2 25 22 - 32 mmol/L   Glucose, Bld 107 (H) 70 - 99 mg/dL   BUN 12 8 - 23 mg/dL   Creatinine, Ser 7.74 0.44 - 1.00 mg/dL   Calcium 7.6 (L) 8.9 - 10.3 mg/dL   GFR, Estimated >12 >87 mL/min   Anion gap 11 5 - 15  CBC with Differential/Platelet     Status: Abnormal   Collection Time: 03/31/21 10:50 PM  Result Value Ref Range   WBC 9.4 4.0 - 10.5 K/uL   RBC 3.79 (L) 3.87 - 5.11 MIL/uL   Hemoglobin 11.2 (L) 12.0 - 15.0 g/dL   HCT 86.7 (L) 67.2 - 09.4 %   MCV 88.9 80.0 - 100.0 fL   MCH 29.6 26.0 - 34.0 pg   MCHC 33.2 30.0 - 36.0 g/dL   RDW 70.9 62.8 - 36.6 %   Platelets 337 150 - 400 K/uL   nRBC 0.0 0.0 - 0.2 %   Neutrophils Relative % 78 %   Neutro Abs 7.3 1.7 - 7.7 K/uL   Lymphocytes Relative 13 %   Lymphs Abs 1.2 0.7 - 4.0 K/uL   Monocytes Relative 7 %   Monocytes Absolute 0.6 0.1 - 1.0 K/uL   Eosinophils  Relative 1 %   Eosinophils Absolute 0.1 0.0 - 0.5 K/uL   Basophils Relative 1 %   Basophils Absolute 0.1 0.0 -  0.1 K/uL   Immature Granulocytes 0 %   Abs Immature Granulocytes 0.04 0.00 - 0.07 K/uL  Urinalysis, Routine w reflex microscopic Urine, Clean Catch     Status: Abnormal   Collection Time: 03/31/21 11:06 PM  Result Value Ref Range   Color, Urine COLORLESS (A) YELLOW   APPearance CLEAR CLEAR   Specific Gravity, Urine 1.007 1.005 - 1.030   pH 7.0 5.0 - 8.0   Glucose, UA NEGATIVE NEGATIVE mg/dL   Hgb urine dipstick NEGATIVE NEGATIVE   Bilirubin Urine NEGATIVE NEGATIVE   Ketones, ur NEGATIVE NEGATIVE mg/dL   Protein, ur NEGATIVE NEGATIVE mg/dL   Nitrite NEGATIVE NEGATIVE   Leukocytes,Ua LARGE (A) NEGATIVE   RBC / HPF 0-5 0 - 5 RBC/hpf   WBC, UA 21-50 0 - 5 WBC/hpf   Squamous Epithelial / LPF 0-5 0 - 5   Mucus PRESENT    Imaging Studies: No results found.  ED COURSE and MDM  Nursing notes, initial and subsequent vitals signs, including pulse oximetry, reviewed and interpreted by myself.  Vitals:   04/01/21 0019 04/01/21 0127 04/01/21 0230 04/01/21 0250  BP: 108/68 (!) 142/80 117/71 (!) 128/97  Pulse: 71 75 71 75  Resp: 13 17 13 18   Temp:    98.3 F (36.8 C)  TempSrc:    Oral  SpO2: 99% 96% 98% 98%   Medications  potassium chloride SA (KLOR-CON) CR tablet 40 mEq (40 mEq Oral Given 04/01/21 0001)  potassium chloride 10 mEq in 100 mL IVPB (0 mEq Intravenous Stopped 04/01/21 0250)  fosfomycin (MONUROL) packet 3 g (3 g Oral Given 04/01/21 0001)  acetaminophen-codeine (TYLENOL #3) 300-30 MG per tablet 1 tablet (1 tablet Oral Given 04/01/21 0100)  acetaminophen (TYLENOL) tablet 650 mg (650 mg Oral Given 04/01/21 0100)   11:50 PM Potassium repletion initiated.  The patient's daughter states she frequently has low potassium when she has a breakthrough seizure.  Her urinalysis is also consistent with a urinary tract infection and we will give her a dose of fosfomycin in the  ED.   PROCEDURES  Procedures   ED DIAGNOSES     ICD-10-CM   1. Seizure (HCC)  R56.9     2. Hypokalemia  E87.6     3. Lower urinary tract infection  N39.0          Demico Ploch, MD 04/01/21 [phone]

## 2021-04-01 MED ORDER — ACETAMINOPHEN 325 MG PO TABS
650.0000 mg | ORAL_TABLET | Freq: Once | ORAL | Status: AC
  Administered 2021-04-01: 650 mg via ORAL
  Filled 2021-04-01: qty 2

## 2021-04-01 MED ORDER — ACETAMINOPHEN-CODEINE #3 300-30 MG PO TABS
1.0000 | ORAL_TABLET | Freq: Once | ORAL | Status: AC
  Administered 2021-04-01: 1 via ORAL
  Filled 2021-04-01: qty 1

## 2021-04-01 MED ORDER — POTASSIUM CHLORIDE CRYS ER 20 MEQ PO TBCR: 20.0000 meq | EXTENDED_RELEASE_TABLET | Freq: Two times a day (BID) | ORAL | 0 refills | Status: AC

## 2021-04-01 NOTE — ED Notes (Signed)
Was informed lab that urine culture has been added

## 2021-04-02 LAB — URINE CULTURE: Culture: NO GROWTH

## 2021-07-04 IMAGING — CT CT HEAD W/O CM
4 series · 16 of 47 positions shown, 18 images · non-contrast
Comparison: 02/04/2020 head CT.

CLINICAL DATA: Headache, dizziness, lightheadedness. No reported
injury.

EXAM:
CT HEAD WITHOUT CONTRAST
TECHNIQUE: Contiguous axial images were obtained from the base of the skull
through the vertex without intravenous contrast.

[Series 3: head without · axial · non-contrast · 0.45mm/px · z∈[+569,+689]mm · 7 of 33 slices shown, 9 images]
[im 5/33  brain]
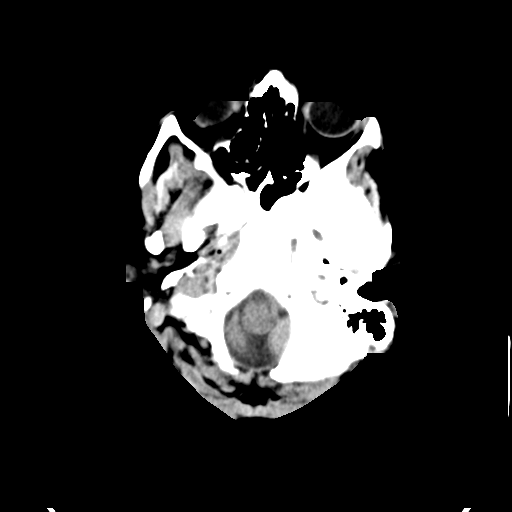
[im 5/33  bone]
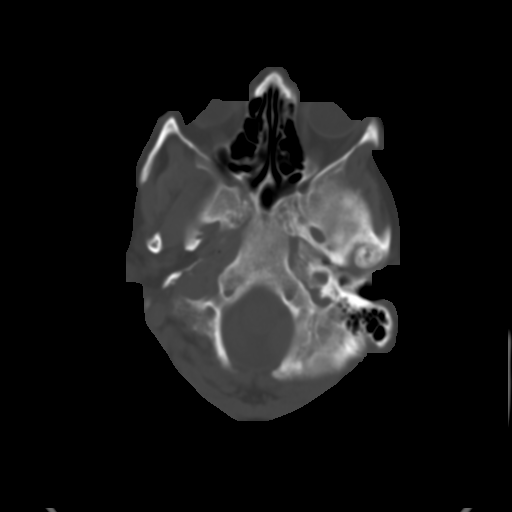
[im 9/33  brain]
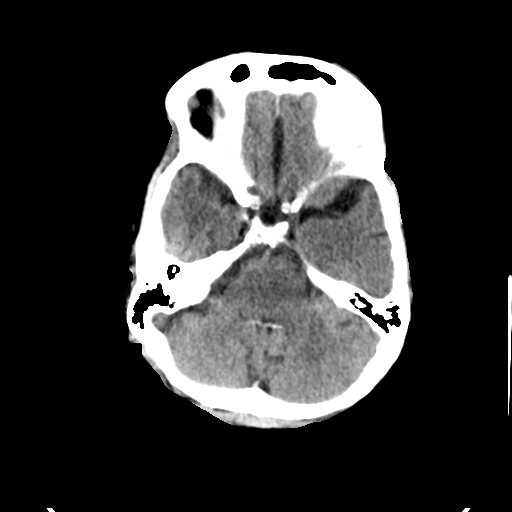
[im 13/33  brain]
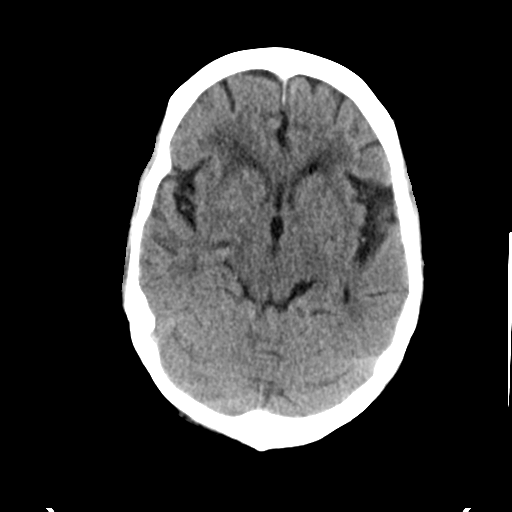
[im 17/33  brain]
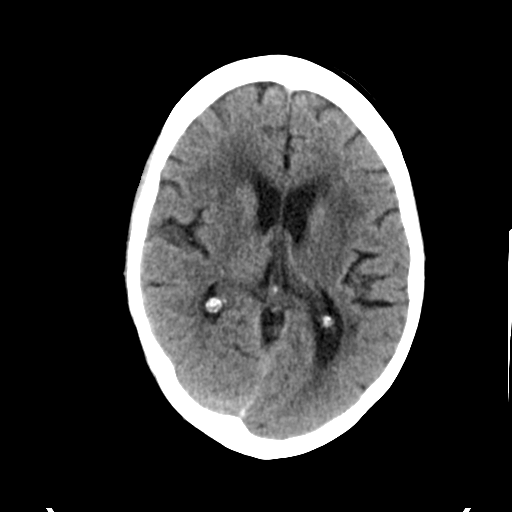
[im 21/33  brain]
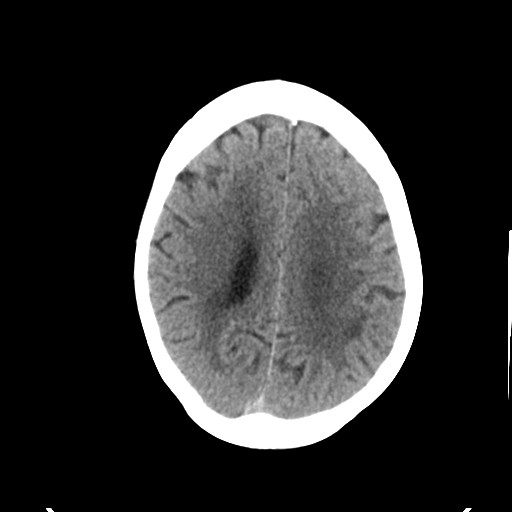
[im 21/33  bone]
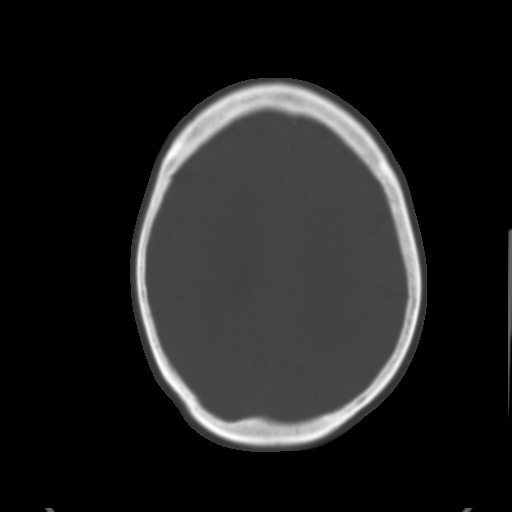
[im 25/33  brain]
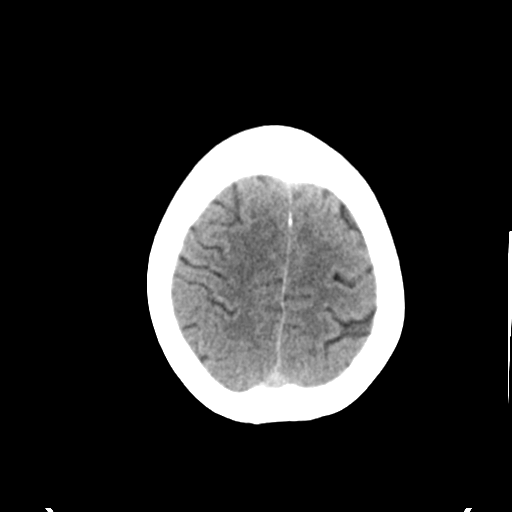
[im 29/33  brain]
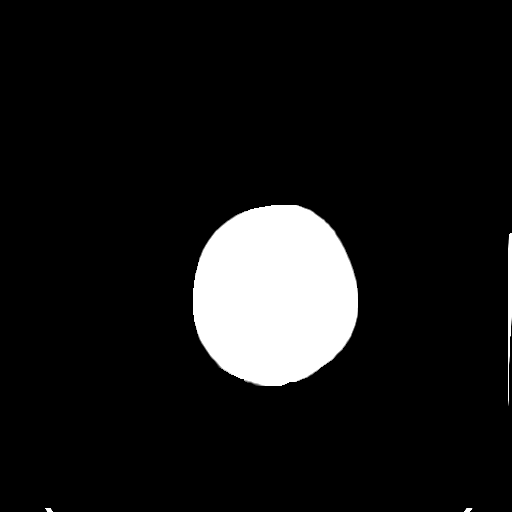

[Series 4: head bone · axial · 0.45mm/px · z∈[+565,+597]mm · 3 of 82 slices shown]
[im 9/82  bone]
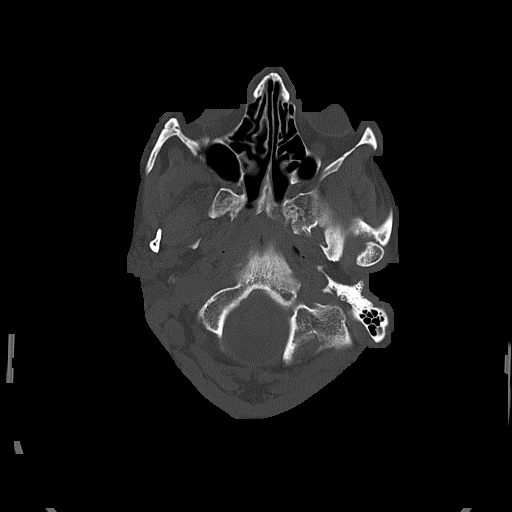
[im 17/82  bone]
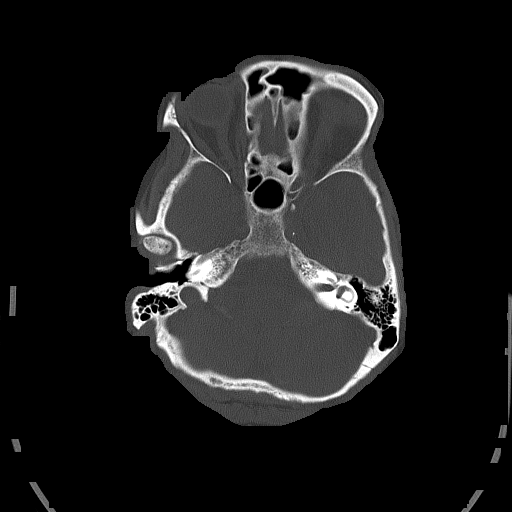
[im 25/82  bone]
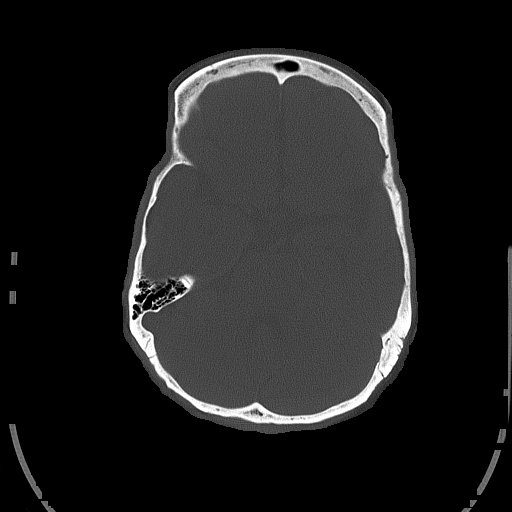

[Series 5: head without cor · coronal · non-contrast · 0.32mm/px · 3 of 67 slices shown]
[im 23/67  brain]
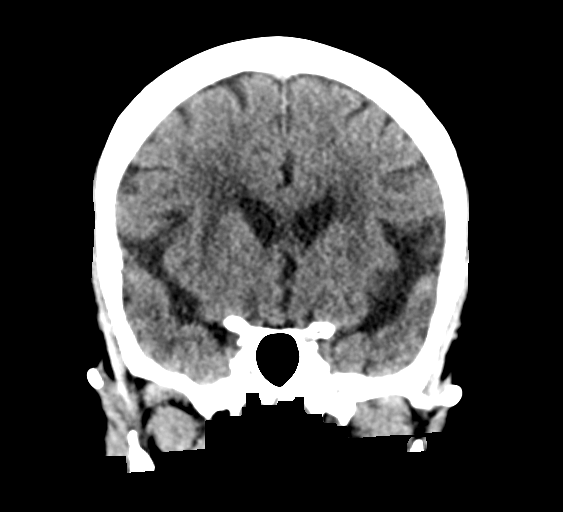
[im 30/67  brain]
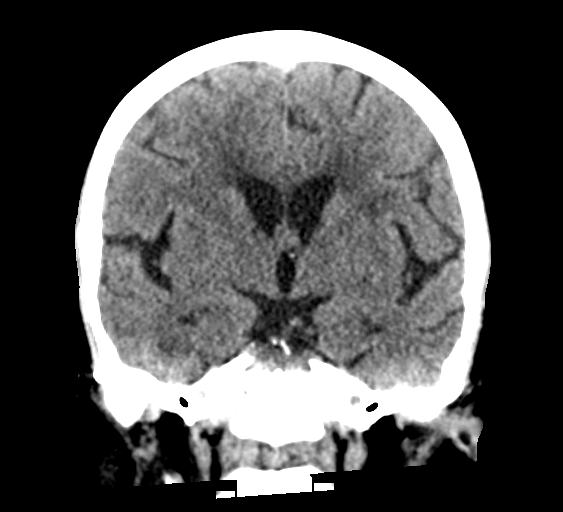
[im 37/67  brain]
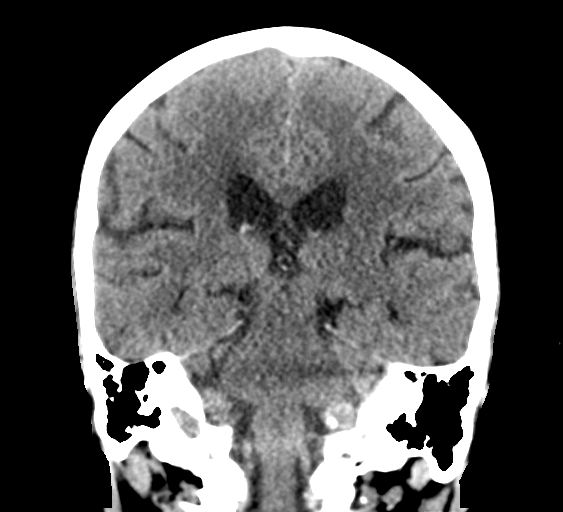

[Series 6: head without sag · sagittal · non-contrast · 0.32mm/px · 3 of 58 slices shown]
[im 20/58  brain]
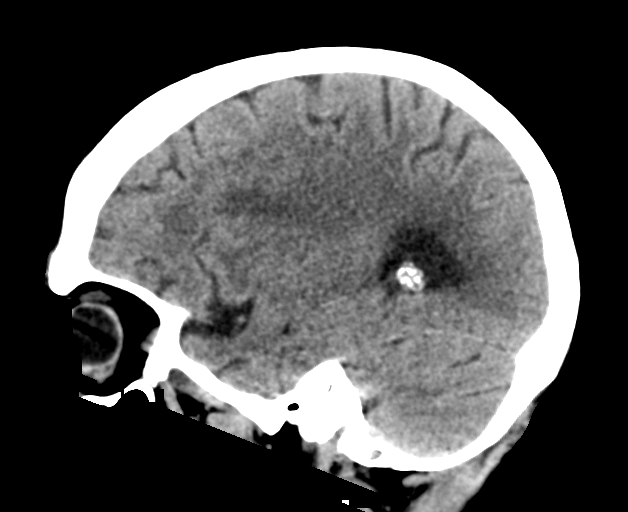
[im 29/58  brain]
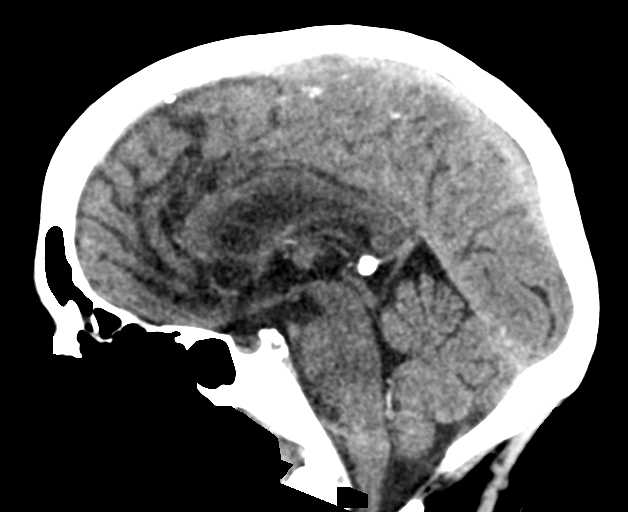
[im 39/58  brain]
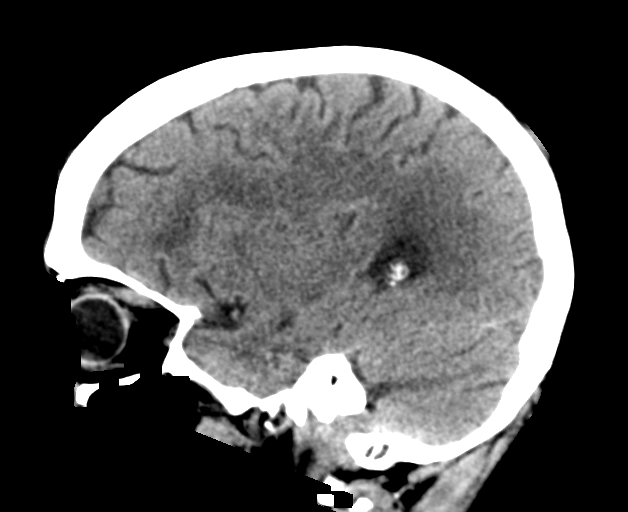

[16 of 47 positions shown; findings below may reference images not displayed]

FINDINGS: Brain: No evidence of parenchymal hemorrhage or extra-axial fluid
collection. No mass lesion, mass effect, or midline shift. No CT
evidence of acute infarction. Nonspecific moderate subcortical and
periventricular white matter hypodensity, most in keeping with
chronic small vessel ischemic change. Cerebral volume is age
appropriate. No ventriculomegaly.

Vascular: No acute abnormality.

Skull: No evidence of calvarial fracture.

Sinuses/Orbits: The visualized paranasal sinuses are essentially
clear.

Other:  The mastoid air cells are unopacified.
IMPRESSION: 1. No evidence of acute intracranial abnormality.
2. Moderate chronic small vessel ischemic changes in the cerebral
white matter.
